# Patient Record
Sex: Female | Born: 1955 | Race: White | Hispanic: No | Marital: Married | Smoking: Never smoker
Health system: Southern US, Community
[De-identification: ages and names within clinical notes are randomized; demographics above are authoritative.]

## PROBLEM LIST (undated history)

## (undated) DIAGNOSIS — J45909 Unspecified asthma, uncomplicated: Secondary | ICD-10-CM

## (undated) DIAGNOSIS — T7840XA Allergy, unspecified, initial encounter: Secondary | ICD-10-CM

## (undated) DIAGNOSIS — Z9889 Other specified postprocedural states: Secondary | ICD-10-CM

## (undated) DIAGNOSIS — R112 Nausea with vomiting, unspecified: Secondary | ICD-10-CM

## (undated) DIAGNOSIS — D649 Anemia, unspecified: Secondary | ICD-10-CM

## (undated) DIAGNOSIS — K635 Polyp of colon: Secondary | ICD-10-CM

## (undated) DIAGNOSIS — I10 Essential (primary) hypertension: Secondary | ICD-10-CM

## (undated) DIAGNOSIS — K760 Fatty (change of) liver, not elsewhere classified: Secondary | ICD-10-CM

## (undated) DIAGNOSIS — K219 Gastro-esophageal reflux disease without esophagitis: Secondary | ICD-10-CM

## (undated) DIAGNOSIS — M1990 Unspecified osteoarthritis, unspecified site: Secondary | ICD-10-CM

## (undated) HISTORY — PX: ABDOMINAL HYSTERECTOMY: SHX81

## (undated) HISTORY — PX: WISDOM TOOTH EXTRACTION: SHX21

## (undated) HISTORY — PX: BREAST SURGERY: SHX581

## (undated) HISTORY — DX: Allergy, unspecified, initial encounter: T78.40XA

## (undated) HISTORY — DX: Polyp of colon: K63.5

## (undated) HISTORY — PX: UPPER GI ENDOSCOPY: SHX6162

## (undated) HISTORY — DX: Fatty (change of) liver, not elsewhere classified: K76.0

## (undated) HISTORY — PX: KNEE ARTHROSCOPY: SUR90

---

## 1976-11-21 HISTORY — PX: TUBAL LIGATION: SHX77

## 1977-11-21 HISTORY — PX: LEG SURGERY: SHX1003

## 1995-11-22 HISTORY — PX: EYE SURGERY: SHX253

## 2000-03-28 ENCOUNTER — Other Ambulatory Visit: Admission: RE | Admit: 2000-03-28 | Discharge: 2000-03-28 | Payer: Self-pay | Admitting: Gynecology

## 2003-09-09 ENCOUNTER — Other Ambulatory Visit: Admission: RE | Admit: 2003-09-09 | Discharge: 2003-09-09 | Payer: Self-pay | Admitting: Gynecology

## 2004-01-28 ENCOUNTER — Ambulatory Visit (HOSPITAL_BASED_OUTPATIENT_CLINIC_OR_DEPARTMENT_OTHER): Admission: RE | Admit: 2004-01-28 | Discharge: 2004-01-28 | Payer: Self-pay | Admitting: Orthopedic Surgery

## 2005-11-21 HISTORY — PX: FRACTURE SURGERY: SHX138

## 2005-12-05 ENCOUNTER — Other Ambulatory Visit: Admission: RE | Admit: 2005-12-05 | Discharge: 2005-12-05 | Payer: Self-pay | Admitting: Gynecology

## 2008-03-21 HISTORY — PX: SHOULDER ARTHROSCOPY: SHX128

## 2008-11-12 ENCOUNTER — Ambulatory Visit (HOSPITAL_BASED_OUTPATIENT_CLINIC_OR_DEPARTMENT_OTHER): Admission: RE | Admit: 2008-11-12 | Discharge: 2008-11-12 | Payer: Self-pay | Admitting: Orthopedic Surgery

## 2011-04-05 NOTE — Op Note (Signed)
NAMEAMINTA, SAKURAI               ACCOUNT NO.:  [id number]   MEDICAL RECORD NO.:  [id number]          PATIENT TYPE:  AMB   LOCATION:  DSC                          FACILITY:  [facility]   PHYSICIAN:  Mila Homer. Sherlean Foot, M.D. DATE OF BIRTH:  09-28-1956   DATE OF PROCEDURE:  11/12/2008  DATE OF DISCHARGE:                               OPERATIVE REPORT   SURGEON:  Mila Homer. Sherlean Foot, MD   ASSISTANT:  Altamese Cabal, PA-C   ANESTHESIA:  General.   PREOPERATIVE DIAGNOSES:  Left shoulder impingement syndrome, rotator  cuff tear, acromioclavicular joint arthritis, and labral tearing.   POSTOPERATIVE DIAGNOSIS:  Left shoulder impingement syndrome, rotator  cuff tear, acromioclavicular joint arthritis, and labral tearing.   PROCEDURE:  Left shoulder arthroscopy, subacromial decompression, distal  clavicle resection, mini-open rotator cuff repair. and glenohumeral  debridement.   INDICATIONS FOR PROCEDURE:  The patient is 55 year old.  Informed  consent was obtained after failure of conservative measures for left  shoulder impingement syndrome and MRI evidence of rotator cuff tear.  The patient was taken to the operative room and administered general  anesthesia.  Left shoulder was prepped and draped in the usual sterile  fashion in the beach-chair position.  The anteroposterior direct lateral  portals were created with #11 blade, blunt trocar, and cannula.  Diagnostic arthroscopy revealed degenerative labral tearing and this was  debrided through the anterior portal.  I then went to the subacromial  space and performed a bursectomy from the lateral portal.  There was  incredible impingement with evidence of 1 x 1 cm rotator cuff tear.  I  then used the ArthroCare debridement wand to debride the CA ligament,  clean off the undersurface of the acromion and distal clavicle.  AC  joint impingement was obvious as well.  I then used a 4.0-mm cylindrical  bur to perform a decompression of the acromion  and the distal clavicle.  I then converted to the mini-open technique.  I extended the lateral  incision up to 3 cm and put the Arthrex shoulder retractor piercing  through the deltoid raphe.  I burred the bare of the humerus through the  tear and then placed a 5.5 Bio-Corkscrew anchor.  I placed a modified  Mason-Allen suture to repair the rotator cuff.  I then digitally  palpated and felt that it secured nicely and that the decompression was  adequate.  I irrigated and closed with 2-0 Vicryl sutures and Steri-  Strips throughout.   COMPLICATIONS:  None.   DRAINS:  None.   DRESSINGS:  Xeroform dressing sponges near shoulder dressing.           ______________________________  Mila Homer. Sherlean Foot, M.D.     SDL/MEDQ  D:  11/12/2008  T:  11/12/2008  Job:  [id number]

## 2011-04-08 NOTE — Op Note (Signed)
NAME:  Sandra Suarez, Sandra Suarez                         ACCOUNT NO.:  [id number]   MEDICAL RECORD NO.:  [id number]                   PATIENT TYPE:  AMB   LOCATION:  DSC                                  FACILITY:  [facility]   PHYSICIAN:  Mila Homer. Sherlean Foot, M.D.              DATE OF BIRTH:  1956/09/10   DATE OF PROCEDURE:  01/28/2004  DATE OF DISCHARGE:                                 OPERATIVE REPORT   SURGEON:  Mila Homer. Sherlean Foot, M.D.   ASSISTANT:  Jamelle Rushing, P.A.   ANESTHESIA:  General plus interscalene block preoperatively.   PREOPERATIVE DIAGNOSIS:  Right shoulder rotator cuff tear.   POSTOPERATIVE DIAGNOSIS:  Right shoulder rotator cuff tear.   PROCEDURE:  Right shoulder arthroscopy with glenohumeral debridement,  subacromial decompression, distal clavicle resection, and mini-open rotator  cuff repair.   INDICATIONS FOR PROCEDURE:  The patient is a 55 year old white female with  MRI evidence of a rotator cuff tearing and synovitis, AC joint changes, as  well.   DESCRIPTION OF PROCEDURE:  The patient was laid supine and administered  general anesthesia after preoperative interscalene block.  She was then  placed in the beach chair position and the right shoulder was prepped and  draped in the usual sterile fashion.  A #11 blade, blunt trocar, and cannula  were used to create anterior, posterior, and direct lateral portals.  Glenohumeral arthroscopy revealed a lot of synovitis in the joint as well as  a large rotator cuff tear.  The anterior portal was used to put the 3.5  Gator shaver in and debride the synovitis.  The ArthroCare wand followed to  obtain hemostasis.  I then redirected the scope from the posterior into the  subacromial space.  She had a very prominent distal clavicular head as well  as a very prominent type 3 acromion.  I used the 4 mm cylindrical bur to  perform a very aggressive anterolateral acromioplasty and distal clavicle  resection.  I then released the CA  ligament and completed a bursectomy.  I  then converted to a mini-open technique by using a #15 blade, extending the  direct lateral portal up for a 3 cm incision in total length.  I then  undermined the skin a bit and placed in an Arthrex shoulder retractor  holding back the deltoid where I had gone through with my portal and split  the deltoid proximally and distally along the length of the incision in line  with its raphe.  I then tagged the rotator cuff with five stay sutures which  were 0 Vicryl placed with a Vicryl punch.  I then debrided the articular  surface of the humerus next to the bare area.  I had to medialize the tendon  approximately 1 cm due to the retracted nature of the tendon.  The rotator  cuff was very large measuring approximately 5 cm AP and retracted about 4  cm.  The subscap was torn anterior to the rotator cuff interval.  I placed a  single 5 mm bio-corkscrew anterior to the biceps tendon and brought down the  subscap with a simple and vertical mattress stitch.  I then oversewed the  interval with a figure-of-eight Vicryl stitch basically tenodesing the  biceps tendon.  I then went posterior to the biceps tendon and placed four  anchors.  I placed three vertical mattress, one horizontal mattress, and  four simple sutures.  I then oversewed that with a couple of #2 FiberWire  sutures closing the rotator cuff interval posteriorly, as well.  This  afforded excellent closure.  It was, however, 1 cm medialized.  There was  adequate decompression.  I then irrigated  and closed the deltoid interval with interrupted 0 Vicryl figure-of-eight  sutures, the subcuticular level with interrupted 2-0 Vicryl sutures, Steri-  Strips, and then a single 4-0 nylon stitch in each portal.  I dressed with  Adaptic, 4 by 4s, ABDs, 2 inch silk tape, a sling and swathe.  Complications  were none.  Drains were none.                                               Mila Homer. Sherlean Foot,  M.D.    SDL/MEDQ  D:  01/28/2004  T:  01/28/2004  Job:  [id number]

## 2011-08-26 LAB — BASIC METABOLIC PANEL
CO2: 26 mEq/L (ref 19–32)
Chloride: 104 mEq/L (ref 96–112)
GFR calc Af Amer: >60 mL/min (ref >60)
Potassium: 4.1 mEq/L (ref 3.5–5.1)
Sodium: 138 mEq/L (ref 135–145)

## 2013-11-21 DIAGNOSIS — K635 Polyp of colon: Secondary | ICD-10-CM

## 2013-11-21 HISTORY — DX: Polyp of colon: K63.5

## 2014-01-03 ENCOUNTER — Other Ambulatory Visit: Payer: Self-pay | Admitting: Orthopedic Surgery

## 2014-01-06 ENCOUNTER — Other Ambulatory Visit: Payer: Self-pay | Admitting: Orthopedic Surgery

## 2014-01-14 ENCOUNTER — Encounter (HOSPITAL_COMMUNITY): Payer: Self-pay | Admitting: Pharmacy Technician

## 2014-01-15 ENCOUNTER — Encounter (HOSPITAL_COMMUNITY): Payer: Self-pay

## 2014-01-15 ENCOUNTER — Inpatient Hospital Stay (HOSPITAL_COMMUNITY)
Admission: RE | Admit: 2014-01-15 | Discharge: 2014-01-15 | Disposition: A | Discharge status: Home / Self Care | Payer: Self-pay | Source: Ambulatory Visit

## 2014-01-15 ENCOUNTER — Encounter (HOSPITAL_COMMUNITY)
Admission: RE | Admit: 2014-01-15 | Discharge: 2014-01-15 | Disposition: A | Discharge status: Home / Self Care | Payer: BC Managed Care – PPO | Source: Ambulatory Visit | Attending: Orthopedic Surgery | Admitting: Orthopedic Surgery

## 2014-01-15 DIAGNOSIS — Z01818 Encounter for other preprocedural examination: Secondary | ICD-10-CM | POA: Insufficient documentation

## 2014-01-15 DIAGNOSIS — Z01812 Encounter for preprocedural laboratory examination: Secondary | ICD-10-CM | POA: Insufficient documentation

## 2014-01-15 HISTORY — DX: Unspecified asthma, uncomplicated: J45.909

## 2014-01-15 HISTORY — DX: Nausea with vomiting, unspecified: R11.2

## 2014-01-15 HISTORY — DX: Gastro-esophageal reflux disease without esophagitis: K21.9

## 2014-01-15 HISTORY — DX: Essential (primary) hypertension: I10

## 2014-01-15 HISTORY — DX: Unspecified osteoarthritis, unspecified site: M19.90

## 2014-01-15 HISTORY — DX: Anemia, unspecified: D64.9

## 2014-01-15 HISTORY — DX: Other specified postprocedural states: Z98.890

## 2014-01-15 LAB — COMPREHENSIVE METABOLIC PANEL
ALT: 44 U/L — ABNORMAL HIGH (ref 0–35)
AST: 28 U/L (ref 0–37)
Albumin: 3.9 g/dL (ref 3.5–5.2)
Alkaline Phosphatase: 118 U/L — ABNORMAL HIGH (ref 39–117)
BUN: 29 mg/dL — ABNORMAL HIGH (ref 6–23)
CO2: 25 mEq/L (ref 19–32)
Calcium: 9.9 mg/dL (ref 8.4–10.5)
Chloride: 101 mEq/L (ref 96–112)
Creatinine, Ser: 0.64 mg/dL (ref 0.50–1.10)
GFR calc Af Amer: >90 mL/min (ref >90)
GFR calc non Af Amer: >90 mL/min (ref >90)
Glucose, Bld: 89 mg/dL (ref 70–99)
Potassium: 4 mEq/L (ref 3.7–5.3)
Sodium: 142 mEq/L (ref 137–147)
Total Bilirubin: 0.4 mg/dL (ref 0.3–1.2)
Total Protein: 7.4 g/dL (ref 6.0–8.3)

## 2014-01-15 LAB — CBC WITH DIFFERENTIAL/PLATELET
Basophils Absolute: 0 10*3/uL (ref 0.0–0.1)
Basophils Relative: 1 % (ref 0–1)
Eosinophils Absolute: 0.1 10*3/uL (ref 0.0–0.7)
Eosinophils Relative: 1 % (ref 0–5)
HCT: 42 % (ref 36.0–46.0)
Hemoglobin: 14.6 g/dL (ref 12.0–15.0)
Lymphocytes Relative: 28 % (ref 12–46)
Lymphs Abs: 2 10*3/uL (ref 0.7–4.0)
MCH: 30.1 pg (ref 26.0–34.0)
MCHC: 34.8 g/dL (ref 30.0–36.0)
MCV: 86.6 fL (ref 78.0–100.0)
Monocytes Absolute: 0.9 10*3/uL (ref 0.1–1.0)
Monocytes Relative: 12 % (ref 3–12)
Neutro Abs: 4.4 10*3/uL (ref 1.7–7.7)
Neutrophils Relative %: 59 % (ref 43–77)
Platelets: 313 10*3/uL (ref 150–400)
RBC: 4.85 MIL/uL (ref 3.87–5.11)
RDW: 12.5 % (ref 11.5–15.5)
WBC: 7.4 10*3/uL (ref 4.0–10.5)

## 2014-01-15 LAB — URINALYSIS, ROUTINE W REFLEX MICROSCOPIC
Bilirubin Urine: NEGATIVE
Glucose, UA: NEGATIVE mg/dL
Hgb urine dipstick: NEGATIVE
Ketones, ur: NEGATIVE mg/dL
Nitrite: NEGATIVE
Protein, ur: NEGATIVE mg/dL
Specific Gravity, Urine: 1.028 (ref 1.005–1.030)
Urobilinogen, UA: 0.2 mg/dL (ref 0.0–1.0)
pH: 5.5 (ref 5.0–8.0)

## 2014-01-15 LAB — TYPE AND SCREEN
ABO/RH(D): A POS
Antibody Screen: NEGATIVE

## 2014-01-15 LAB — APTT: aPTT: 24 seconds (ref 24–37)

## 2014-01-15 LAB — URINE MICROSCOPIC-ADD ON

## 2014-01-15 LAB — ABO/RH: ABO/RH(D): A POS

## 2014-01-15 LAB — SURGICAL PCR SCREEN
MRSA, PCR: NEGATIVE
Staphylococcus aureus: NEGATIVE

## 2014-01-15 LAB — PROTIME-INR
INR: 0.96 (ref 0.00–1.49)
Prothrombin Time: 12.6 seconds (ref 11.6–15.2)

## 2014-01-15 NOTE — Progress Notes (Signed)
01/15/14 1130  OBSTRUCTIVE SLEEP APNEA  Have you ever been diagnosed with sleep apnea through a sleep study? No  Do you snore loudly (loud enough to be heard through closed doors)?  1  Do you often feel tired, fatigued, or sleepy during the daytime? 0  Has anyone observed you stop breathing during your sleep? 0  Do you have, or are you being treated for high blood pressure? 1  BMI more than 35 kg/m2? 1  Age over 58 years old? 1  Neck circumference greater than 40 cm/18 inches? 0 (15.5)  Gender: 0  Obstructive Sleep Apnea Score 4  Score 4 or greater  Results sent to PCP

## 2014-01-15 NOTE — Pre-Procedure Instructions (Addendum)
Sandra Suarez  01/15/2014   Your procedure is scheduled on:  Monday January 20, 2014 at 8:45 AM.  Report to [place] Stay Entrance "A"  Admitting at 630 AM.  Call this number if you have problems the morning of surgery: [phone]   Remember:   Do not eat food or drink liquids after midnight.   Take these medicines the morning of surgery with A SIP OF WATER: prevacid,singulair         STOP all herbel meds, nsaids (aleve,naproxen,advil,ibuprofen) now including vitamins ,aspirin   Do not wear jewelry, make-up or nail polish.  Do not wear lotions, powders, or perfumes. You may wear deodorant.  Do not shave 48 hours prior to surgery.   Do not bring valuables to the hospital.  [facility] is not responsible for any belongings or valuables.               Contacts, dentures or bridgework may not be worn into surgery.  Leave suitcase in the car. After surgery it may be brought to your room.  For patients admitted to the hospital, discharge time is determined by your treatment team.               Patients discharged the day of surgery will not be allowed to drive home.  Name and phone number of your driver: Family/Friend  Special Instructions:  Special Instructions: [name] - Preparing for Surgery  Before surgery, you can play an important role.  Because skin is not sterile, your skin needs to be as free of germs as possible.  You can reduce the number of germs on you skin by washing with CHG (chlorahexidine gluconate) soap before surgery.  CHG is an antiseptic cleaner which kills germs and bonds with the skin to continue killing germs even after washing.  Please DO NOT use if you have an allergy to CHG or antibacterial soaps.  If your skin becomes reddened/irritated stop using the CHG and inform your nurse when you arrive at Short Stay.  Do not shave (including legs and underarms) for at least 48 hours prior to the first CHG shower.  You may shave your face.  Please follow these  instructions carefully:   1.  Shower with CHG Soap the night before surgery and the morning of Surgery.  2.  If you choose to wash your hair, wash your hair first as usual with your normal shampoo.  3.  After you shampoo, rinse your hair and body thoroughly to remove the Shampoo.  4.  Use CHG as you would any other liquid soap.  You can apply chg directly  to the skin and wash gently with scrungie or a clean washcloth.  5.  Apply the CHG Soap to your body ONLY FROM THE NECK DOWN.  Do not use on open wounds or open sores.  Avoid contact with your eyes ears, mouth and genitals (private parts).  Wash genitals (private parts)       with your normal soap.  6.  Wash thoroughly, paying special attention to the area where your surgery will be performed.  7.  Thoroughly rinse your body with warm water from the neck down.  8.  DO NOT shower/wash with your normal soap after using and rinsing off the CHG Soap.  9.  Pat yourself dry with a clean towel.            10.  Wear clean pajamas.  11.  Place clean sheets on your bed the night of your first shower and do not sleep with pets.  Day of Surgery  Do not apply any lotions/deodorants the morning of surgery.  Please wear clean clothes to the hospital/surgery center.   Please read over the following fact sheets that you were given: Pain Booklet, Coughing and Deep Breathing, Blood Transfusion Information, Total Joint Packet, MRSA Information and Surgical Site Infection Prevention

## 2014-01-15 NOTE — Progress Notes (Signed)
req'd ekg from [facility] family [place]

## 2014-01-16 LAB — URINE CULTURE

## 2014-01-19 MED ORDER — CEFAZOLIN SODIUM-DEXTROSE 2-3 GM-% IV SOLR
2.0000 g | INTRAVENOUS | Status: AC
  Administered 2014-01-20: 2 g via INTRAVENOUS
  Filled 2014-01-19: qty 50

## 2014-01-19 MED ORDER — SODIUM CHLORIDE 0.9 % IV SOLN
1000.0000 mg | INTRAVENOUS | Status: AC
  Administered 2014-01-20: 1000 mg via INTRAVENOUS
  Filled 2014-01-19: qty 10

## 2014-01-19 MED ORDER — BUPIVACAINE LIPOSOME 1.3 % IJ SUSP
20.0000 mL | Freq: Once | INTRAMUSCULAR | Status: DC
  Filled 2014-01-19: qty 20

## 2014-01-20 ENCOUNTER — Inpatient Hospital Stay (HOSPITAL_COMMUNITY): Payer: BC Managed Care – PPO | Admitting: Anesthesiology

## 2014-01-20 ENCOUNTER — Encounter (HOSPITAL_COMMUNITY)
Admission: RE | Disposition: A | Discharge status: Home Health | Payer: Self-pay | Source: Ambulatory Visit | Attending: Orthopedic Surgery

## 2014-01-20 ENCOUNTER — Encounter (HOSPITAL_COMMUNITY): Payer: BC Managed Care – PPO | Admitting: Anesthesiology

## 2014-01-20 ENCOUNTER — Encounter (HOSPITAL_COMMUNITY): Payer: Self-pay | Admitting: *Deleted

## 2014-01-20 ENCOUNTER — Inpatient Hospital Stay (HOSPITAL_COMMUNITY)
Admission: RE | Admit: 2014-01-20 | Discharge: 2014-01-21 | DRG: 470 | Disposition: A | Discharge status: Home Health | Payer: BC Managed Care – PPO | Source: Ambulatory Visit | Attending: Orthopedic Surgery | Admitting: Orthopedic Surgery

## 2014-01-20 DIAGNOSIS — R112 Nausea with vomiting, unspecified: Secondary | ICD-10-CM | POA: Diagnosis not present

## 2014-01-20 DIAGNOSIS — D62 Acute posthemorrhagic anemia: Secondary | ICD-10-CM | POA: Diagnosis not present

## 2014-01-20 DIAGNOSIS — Z9851 Tubal ligation status: Secondary | ICD-10-CM

## 2014-01-20 DIAGNOSIS — Z96659 Presence of unspecified artificial knee joint: Secondary | ICD-10-CM

## 2014-01-20 DIAGNOSIS — M1710 Unilateral primary osteoarthritis, unspecified knee: Principal | ICD-10-CM | POA: Diagnosis present

## 2014-01-20 DIAGNOSIS — K219 Gastro-esophageal reflux disease without esophagitis: Secondary | ICD-10-CM | POA: Diagnosis present

## 2014-01-20 DIAGNOSIS — I10 Essential (primary) hypertension: Secondary | ICD-10-CM | POA: Diagnosis present

## 2014-01-20 DIAGNOSIS — J45909 Unspecified asthma, uncomplicated: Secondary | ICD-10-CM | POA: Diagnosis present

## 2014-01-20 HISTORY — PX: TOTAL KNEE ARTHROPLASTY: SHX125

## 2014-01-20 LAB — CBC
HCT: 38.1 % (ref 36.0–46.0)
Hemoglobin: 12.8 g/dL (ref 12.0–15.0)
MCH: 29.8 pg (ref 26.0–34.0)
MCHC: 33.6 g/dL (ref 30.0–36.0)
MCV: 88.8 fL (ref 78.0–100.0)
PLATELETS: 249 10*3/uL (ref 150–400)
RBC: 4.29 MIL/uL (ref 3.87–5.11)
RDW: 12.6 % (ref 11.5–15.5)
WBC: 12.9 10*3/uL — AB (ref 4.0–10.5)

## 2014-01-20 LAB — CREATININE, SERUM
CREATININE: 0.65 mg/dL (ref 0.50–1.10)
GFR calc non Af Amer: >90 mL/min (ref >90)

## 2014-01-20 SURGERY — ARTHROPLASTY, KNEE, TOTAL
Anesthesia: General | Site: Knee | Laterality: Left

## 2014-01-20 MED ORDER — BUPIVACAINE-EPINEPHRINE PF 0.5-1:200000 % IJ SOLN
INTRAMUSCULAR | Status: DC | PRN
  Administered 2014-01-20: 30 mL via PERINEURAL

## 2014-01-20 MED ORDER — ONDANSETRON HCL 4 MG/2ML IJ SOLN
INTRAMUSCULAR | Status: DC | PRN
  Administered 2014-01-20: 4 mg via INTRAVENOUS

## 2014-01-20 MED ORDER — METOCLOPRAMIDE HCL 10 MG PO TABS: 5.0000 mg | ORAL_TABLET | Freq: Three times a day (TID) | ORAL | Status: DC | PRN

## 2014-01-20 MED ORDER — HYDROCHLOROTHIAZIDE 12.5 MG PO CAPS
12.5000 mg | ORAL_CAPSULE | Freq: Every day | ORAL | Status: DC
  Administered 2014-01-20 – 2014-01-21 (×2): 12.5 mg via ORAL
  Filled 2014-01-20 (×2): qty 1

## 2014-01-20 MED ORDER — FENTANYL CITRATE 0.05 MG/ML IJ SOLN
INTRAMUSCULAR | Status: AC
  Filled 2014-01-20: qty 5

## 2014-01-20 MED ORDER — OXYCODONE HCL 5 MG PO TABS: 5.0000 mg | ORAL_TABLET | Freq: Once | ORAL | Status: DC | PRN

## 2014-01-20 MED ORDER — BUPIVACAINE LIPOSOME 1.3 % IJ SUSP
INTRAMUSCULAR | Status: DC | PRN
  Administered 2014-01-20: 20 mL

## 2014-01-20 MED ORDER — CHLORHEXIDINE GLUCONATE 4 % EX LIQD
60.0000 mL | Freq: Once | CUTANEOUS | Status: DC
  Filled 2014-01-20: qty 60

## 2014-01-20 MED ORDER — ENOXAPARIN SODIUM 30 MG/0.3ML ~~LOC~~ SOLN
30.0000 mg | Freq: Two times a day (BID) | SUBCUTANEOUS | Status: DC
Start: 2014-01-21 — End: 2014-01-21
  Administered 2014-01-21: 30 mg via SUBCUTANEOUS
  Filled 2014-01-20 (×3): qty 0.3

## 2014-01-20 MED ORDER — HYPROMELLOSE (GONIOSCOPIC) 2.5 % OP SOLN
1.0000 [drp] | Freq: Every day | OPHTHALMIC | Status: DC | PRN
  Filled 2014-01-20: qty 15

## 2014-01-20 MED ORDER — OXYCODONE HCL 5 MG/5ML PO SOLN: 5.0000 mg | Freq: Once | ORAL | Status: DC | PRN

## 2014-01-20 MED ORDER — ACETAMINOPHEN 325 MG PO TABS: 650.0000 mg | ORAL_TABLET | Freq: Four times a day (QID) | ORAL | Status: DC | PRN

## 2014-01-20 MED ORDER — MIDAZOLAM HCL 5 MG/5ML IJ SOLN
INTRAMUSCULAR | Status: DC | PRN
  Administered 2014-01-20 (×4): 1 mg via INTRAVENOUS

## 2014-01-20 MED ORDER — PHENOL 1.4 % MT LIQD: 1.0000 | OROMUCOSAL | Status: DC | PRN

## 2014-01-20 MED ORDER — BUPIVACAINE-EPINEPHRINE 0.5% -1:200000 IJ SOLN
INTRAMUSCULAR | Status: DC | PRN
  Administered 2014-01-20: 30 mL

## 2014-01-20 MED ORDER — LACTATED RINGERS IV SOLN
INTRAVENOUS | Status: DC | PRN
  Administered 2014-01-20 (×2): via INTRAVENOUS

## 2014-01-20 MED ORDER — SENNOSIDES-DOCUSATE SODIUM 8.6-50 MG PO TABS: 1.0000 | ORAL_TABLET | Freq: Every evening | ORAL | Status: DC | PRN

## 2014-01-20 MED ORDER — MIDAZOLAM HCL 2 MG/2ML IJ SOLN
INTRAMUSCULAR | Status: AC
  Filled 2014-01-20: qty 2

## 2014-01-20 MED ORDER — BISACODYL 5 MG PO TBEC: 5.0000 mg | DELAYED_RELEASE_TABLET | Freq: Every day | ORAL | Status: DC | PRN

## 2014-01-20 MED ORDER — HYDROMORPHONE HCL PF 1 MG/ML IJ SOLN
0.2500 mg | INTRAMUSCULAR | Status: DC | PRN
  Administered 2014-01-20 (×2): 0.5 mg via INTRAVENOUS

## 2014-01-20 MED ORDER — METHOCARBAMOL 500 MG PO TABS
500.0000 mg | ORAL_TABLET | Freq: Four times a day (QID) | ORAL | Status: DC | PRN
  Administered 2014-01-21: 500 mg via ORAL
  Filled 2014-01-20: qty 1

## 2014-01-20 MED ORDER — FENTANYL CITRATE 0.05 MG/ML IJ SOLN
INTRAMUSCULAR | Status: DC | PRN
  Administered 2014-01-20 (×5): 50 ug via INTRAVENOUS

## 2014-01-20 MED ORDER — OXYCODONE HCL ER 10 MG PO T12A
10.0000 mg | EXTENDED_RELEASE_TABLET | Freq: Two times a day (BID) | ORAL | Status: DC
  Administered 2014-01-20: 10 mg via ORAL
  Filled 2014-01-20 (×3): qty 1

## 2014-01-20 MED ORDER — ONDANSETRON HCL 4 MG/2ML IJ SOLN
INTRAMUSCULAR | Status: AC
  Filled 2014-01-20: qty 2

## 2014-01-20 MED ORDER — FERROUS FUMARATE 325 (106 FE) MG PO TABS
1.0000 | ORAL_TABLET | Freq: Every day | ORAL | Status: DC
  Filled 2014-01-20 (×2): qty 1

## 2014-01-20 MED ORDER — FLUTICASONE PROPIONATE HFA 44 MCG/ACT IN AERO
2.0000 | INHALATION_SPRAY | Freq: Two times a day (BID) | RESPIRATORY_TRACT | Status: DC
  Filled 2014-01-20: qty 10.6

## 2014-01-20 MED ORDER — ACETAMINOPHEN 650 MG RE SUPP: 650.0000 mg | Freq: Four times a day (QID) | RECTAL | Status: DC | PRN

## 2014-01-20 MED ORDER — DIPHENHYDRAMINE HCL 12.5 MG/5ML PO ELIX: 12.5000 mg | ORAL_SOLUTION | ORAL | Status: DC | PRN

## 2014-01-20 MED ORDER — ONDANSETRON HCL 4 MG/2ML IJ SOLN: 4.0000 mg | Freq: Once | INTRAMUSCULAR | Status: DC | PRN

## 2014-01-20 MED ORDER — PROPOFOL 10 MG/ML IV BOLUS
INTRAVENOUS | Status: AC
  Filled 2014-01-20: qty 20

## 2014-01-20 MED ORDER — ALUM & MAG HYDROXIDE-SIMETH 200-200-20 MG/5ML PO SUSP: 30.0000 mL | ORAL | Status: DC | PRN

## 2014-01-20 MED ORDER — ARTIFICIAL TEARS OP OINT
TOPICAL_OINTMENT | OPHTHALMIC | Status: DC | PRN
  Administered 2014-01-20: 1 via OPHTHALMIC

## 2014-01-20 MED ORDER — ONDANSETRON HCL 4 MG/2ML IJ SOLN
4.0000 mg | Freq: Four times a day (QID) | INTRAMUSCULAR | Status: DC | PRN
  Administered 2014-01-20: 4 mg via INTRAVENOUS
  Filled 2014-01-20: qty 2

## 2014-01-20 MED ORDER — LIDOCAINE HCL (CARDIAC) 20 MG/ML IV SOLN
INTRAVENOUS | Status: DC | PRN
  Administered 2014-01-20: 100 mg via INTRAVENOUS

## 2014-01-20 MED ORDER — HYDROMORPHONE HCL PF 1 MG/ML IJ SOLN
1.0000 mg | INTRAMUSCULAR | Status: DC | PRN
  Administered 2014-01-20: 1 mg via INTRAVENOUS
  Filled 2014-01-20: qty 1

## 2014-01-20 MED ORDER — ONDANSETRON HCL 4 MG PO TABS: 4.0000 mg | ORAL_TABLET | Freq: Four times a day (QID) | ORAL | Status: DC | PRN

## 2014-01-20 MED ORDER — SODIUM CHLORIDE 0.9 % IV SOLN: INTRAVENOUS | Status: DC

## 2014-01-20 MED ORDER — SODIUM CHLORIDE 0.9 % IV SOLN
INTRAVENOUS | Status: DC
  Administered 2014-01-20: 13:00:00 via INTRAVENOUS

## 2014-01-20 MED ORDER — CEFAZOLIN SODIUM-DEXTROSE 2-3 GM-% IV SOLR
2.0000 g | Freq: Four times a day (QID) | INTRAVENOUS | Status: AC
  Administered 2014-01-20 (×2): 2 g via INTRAVENOUS
  Filled 2014-01-20 (×3): qty 50

## 2014-01-20 MED ORDER — PROPOFOL 10 MG/ML IV BOLUS
INTRAVENOUS | Status: DC | PRN
  Administered 2014-01-20: 150 mg via INTRAVENOUS

## 2014-01-20 MED ORDER — DEXTROSE 5 % IV SOLN
INTRAVENOUS | Status: DC | PRN
  Administered 2014-01-20: 09:00:00 via INTRAVENOUS

## 2014-01-20 MED ORDER — LIDOCAINE HCL (CARDIAC) 20 MG/ML IV SOLN
INTRAVENOUS | Status: AC
  Filled 2014-01-20: qty 5

## 2014-01-20 MED ORDER — BUPIVACAINE-EPINEPHRINE (PF) 0.5% -1:200000 IJ SOLN
INTRAMUSCULAR | Status: AC
  Filled 2014-01-20: qty 10

## 2014-01-20 MED ORDER — DOCUSATE SODIUM 100 MG PO CAPS
100.0000 mg | ORAL_CAPSULE | Freq: Two times a day (BID) | ORAL | Status: DC
  Administered 2014-01-20 – 2014-01-21 (×3): 100 mg via ORAL
  Filled 2014-01-20 (×4): qty 1

## 2014-01-20 MED ORDER — MEPERIDINE HCL 25 MG/ML IJ SOLN: 6.2500 mg | INTRAMUSCULAR | Status: DC | PRN

## 2014-01-20 MED ORDER — 0.9 % SODIUM CHLORIDE (POUR BTL) OPTIME
TOPICAL | Status: DC | PRN
  Administered 2014-01-20: 1000 mL

## 2014-01-20 MED ORDER — IRBESARTAN 150 MG PO TABS
150.0000 mg | ORAL_TABLET | Freq: Every day | ORAL | Status: DC
  Administered 2014-01-20 – 2014-01-21 (×2): 150 mg via ORAL
  Filled 2014-01-20 (×2): qty 1

## 2014-01-20 MED ORDER — MENTHOL 3 MG MT LOZG: 1.0000 | LOZENGE | OROMUCOSAL | Status: DC | PRN

## 2014-01-20 MED ORDER — BUDESONIDE-FORMOTEROL FUMARATE 80-4.5 MCG/ACT IN AERO
2.0000 | INHALATION_SPRAY | Freq: Two times a day (BID) | RESPIRATORY_TRACT | Status: DC
  Administered 2014-01-20 – 2014-01-21 (×2): 2 via RESPIRATORY_TRACT
  Filled 2014-01-20: qty 6.9

## 2014-01-20 MED ORDER — METHOCARBAMOL 100 MG/ML IJ SOLN
500.0000 mg | Freq: Four times a day (QID) | INTRAVENOUS | Status: DC | PRN
  Administered 2014-01-20: 500 mg via INTRAVENOUS
  Filled 2014-01-20 (×3): qty 5

## 2014-01-20 MED ORDER — FLEET ENEMA 7-19 GM/118ML RE ENEM: 1.0000 | ENEMA | Freq: Once | RECTAL | Status: AC | PRN

## 2014-01-20 MED ORDER — METOCLOPRAMIDE HCL 5 MG/ML IJ SOLN
5.0000 mg | Freq: Three times a day (TID) | INTRAMUSCULAR | Status: DC | PRN
  Administered 2014-01-20: 10 mg via INTRAVENOUS
  Filled 2014-01-20: qty 2

## 2014-01-20 MED ORDER — OXYCODONE HCL 5 MG PO TABS
5.0000 mg | ORAL_TABLET | ORAL | Status: DC | PRN
  Administered 2014-01-21: 5 mg via ORAL
  Administered 2014-01-21 (×2): 10 mg via ORAL
  Administered 2014-01-21: 5 mg via ORAL
  Filled 2014-01-20: qty 1
  Filled 2014-01-20 (×2): qty 2
  Filled 2014-01-20: qty 1

## 2014-01-20 MED ORDER — SODIUM CHLORIDE 0.9 % IR SOLN
Status: DC | PRN
  Administered 2014-01-20: 3000 mL

## 2014-01-20 MED ORDER — CHLORHEXIDINE GLUCONATE 4 % EX LIQD
60.0000 mL | Freq: Once | CUTANEOUS | Status: DC
Start: 2014-01-20 — End: 2014-01-20
  Filled 2014-01-20: qty 60

## 2014-01-20 MED ORDER — MONTELUKAST SODIUM 10 MG PO TABS
10.0000 mg | ORAL_TABLET | Freq: Every day | ORAL | Status: DC
  Administered 2014-01-21: 10 mg via ORAL
  Filled 2014-01-20: qty 1

## 2014-01-20 MED ORDER — PANTOPRAZOLE SODIUM 20 MG PO TBEC
20.0000 mg | DELAYED_RELEASE_TABLET | Freq: Every day | ORAL | Status: DC
  Administered 2014-01-21: 20 mg via ORAL
  Filled 2014-01-20: qty 1

## 2014-01-20 MED ORDER — LACTATED RINGERS IV SOLN
INTRAVENOUS | Status: DC
  Administered 2014-01-20: 07:00:00 via INTRAVENOUS

## 2014-01-20 MED ORDER — VALSARTAN-HYDROCHLOROTHIAZIDE 160-12.5 MG PO TABS: 1.0000 | ORAL_TABLET | Freq: Every day | ORAL | Status: DC

## 2014-01-20 MED ORDER — CELECOXIB 200 MG PO CAPS
200.0000 mg | ORAL_CAPSULE | Freq: Two times a day (BID) | ORAL | Status: DC
  Administered 2014-01-20 – 2014-01-21 (×3): 200 mg via ORAL
  Filled 2014-01-20 (×4): qty 1

## 2014-01-20 MED ORDER — HYDROMORPHONE HCL PF 1 MG/ML IJ SOLN
INTRAMUSCULAR | Status: AC
  Administered 2014-01-20: 0.5 mg via INTRAVENOUS
  Filled 2014-01-20: qty 1

## 2014-01-20 SURGICAL SUPPLY — 52 items
BANDAGE ESMARK 6X9 LF (GAUZE/BANDAGES/DRESSINGS) ×1 IMPLANT
BLADE SAGITTAL 13X1.27X60 (BLADE) ×2 IMPLANT
BLADE SAW SGTL 83.5X18.5 (BLADE) ×2 IMPLANT
BNDG ESMARK 6X9 LF (GAUZE/BANDAGES/DRESSINGS) ×2
BOWL SMART MIX CTS (DISPOSABLE) ×2 IMPLANT
CAP POR NKTM CP VIT E LN CER ×2 IMPLANT
CEMENT BONE SIMPLEX SPEEDSET (Cement) ×4 IMPLANT
COVER SURGICAL LIGHT HANDLE (MISCELLANEOUS) ×2 IMPLANT
CUFF TOURNIQUET SINGLE 34IN LL (TOURNIQUET CUFF) ×2 IMPLANT
DRAPE EXTREMITY T 121X128X90 (DRAPE) ×2 IMPLANT
DRAPE INCISE IOBAN 66X45 STRL (DRAPES) ×4 IMPLANT
DRAPE PROXIMA HALF (DRAPES) ×2 IMPLANT
DRAPE U-SHAPE 47X51 STRL (DRAPES) ×2 IMPLANT
DRSG ADAPTIC 3X8 NADH LF (GAUZE/BANDAGES/DRESSINGS) ×2 IMPLANT
DRSG PAD ABDOMINAL 8X10 ST (GAUZE/BANDAGES/DRESSINGS) ×2 IMPLANT
DURAPREP 26ML APPLICATOR (WOUND CARE) ×4 IMPLANT
ELECT REM PT RETURN 9FT ADLT (ELECTROSURGICAL) ×4
ELECTRODE REM PT RTRN 9FT ADLT (ELECTROSURGICAL) ×2 IMPLANT
EVACUATOR 1/8 PVC DRAIN (DRAIN) ×2 IMPLANT
GLOVE BIOGEL M 7.0 STRL (GLOVE) ×2 IMPLANT
GLOVE BIOGEL PI IND STRL 7.5 (GLOVE) ×1 IMPLANT
GLOVE BIOGEL PI IND STRL 8.5 (GLOVE) ×2 IMPLANT
GLOVE BIOGEL PI INDICATOR 7.5 (GLOVE) ×1
GLOVE BIOGEL PI INDICATOR 8.5 (GLOVE) ×2
GLOVE SURG ORTHO 8.0 STRL STRW (GLOVE) ×4 IMPLANT
GOWN PREVENTION PLUS XLARGE (GOWN DISPOSABLE) ×4 IMPLANT
GOWN STRL NON-REIN LRG LVL3 (GOWN DISPOSABLE) ×2 IMPLANT
HANDPIECE INTERPULSE COAX TIP (DISPOSABLE) ×1
HOOD PEEL AWAY FACE SHEILD DIS (HOOD) ×6 IMPLANT
KIT BASIN OR (CUSTOM PROCEDURE TRAY) ×2 IMPLANT
KIT ROOM TURNOVER OR (KITS) ×2 IMPLANT
MANIFOLD NEPTUNE II (INSTRUMENTS) ×2 IMPLANT
NEEDLE 22X1 1/2 (OR ONLY) (NEEDLE) ×2 IMPLANT
NS IRRIG 1000ML POUR BTL (IV SOLUTION) ×2 IMPLANT
PACK TOTAL JOINT (CUSTOM PROCEDURE TRAY) ×2 IMPLANT
PAD ARMBOARD 7.5X6 YLW CONV (MISCELLANEOUS) ×4 IMPLANT
PADDING CAST COTTON 6X4 STRL (CAST SUPPLIES) ×2 IMPLANT
SET HNDPC FAN SPRY TIP SCT (DISPOSABLE) ×1 IMPLANT
SPONGE GAUZE 4X4 12PLY (GAUZE/BANDAGES/DRESSINGS) ×2 IMPLANT
STAPLER VISISTAT 35W (STAPLE) ×2 IMPLANT
SUCTION FRAZIER TIP 10 FR DISP (SUCTIONS) ×2 IMPLANT
SUT BONE WAX W31G (SUTURE) ×2 IMPLANT
SUT VIC AB 0 CTB1 27 (SUTURE) ×4 IMPLANT
SUT VIC AB 1 CT1 27 (SUTURE) ×2
SUT VIC AB 1 CT1 27XBRD ANBCTR (SUTURE) ×2 IMPLANT
SUT VIC AB 2-0 CT1 27 (SUTURE) ×2
SUT VIC AB 2-0 CT1 TAPERPNT 27 (SUTURE) ×2 IMPLANT
SYR CONTROL 10ML LL (SYRINGE) ×4 IMPLANT
TOWEL OR 17X24 6PK STRL BLUE (TOWEL DISPOSABLE) ×2 IMPLANT
TOWEL OR 17X26 10 PK STRL BLUE (TOWEL DISPOSABLE) ×2 IMPLANT
TRAY FOLEY CATH 14FR (SET/KITS/TRAYS/PACK) IMPLANT
WATER STERILE IRR 1000ML POUR (IV SOLUTION) IMPLANT

## 2014-01-20 NOTE — Anesthesia Preprocedure Evaluation (Addendum)
Anesthesia Evaluation  Patient identified by MRN, date of birth, ID band Patient awake    Reviewed: Allergy & Precautions, H&P , NPO status , Patient's Chart, lab work & pertinent test results, reviewed documented beta blocker date and time   History of Anesthesia Complications (+) PONV  Airway Mallampati: II TM Distance: >3 FB Neck ROM: Full    Dental  (+) Teeth Intact, Dental Advisory Given   Pulmonary asthma ,          Cardiovascular hypertension, Pt. on medications     Neuro/Psych    GI/Hepatic GERD-  Medicated and Controlled,  Endo/Other    Renal/GU      Musculoskeletal   Abdominal   Peds  Hematology   Anesthesia Other Findings   Reproductive/Obstetrics                          Anesthesia Physical Anesthesia Plan  ASA: III  Anesthesia Plan: General   Post-op Pain Management:    Induction: Intravenous  Airway Management Planned: LMA  Additional Equipment:   Intra-op Plan:   Post-operative Plan: Extubation in OR  Informed Consent: I have reviewed the patients History and Physical, chart, labs and discussed the procedure including the risks, benefits and alternatives for the proposed anesthesia with the patient or authorized representative who has indicated his/her understanding and acceptance.     Plan Discussed with: CRNA and Surgeon  Anesthesia Plan Comments:         Anesthesia Quick Evaluation

## 2014-01-20 NOTE — Anesthesia Postprocedure Evaluation (Signed)
Anesthesia Post Note  Patient: Sandra Suarez  Procedure(s) Performed: Procedure(s) (LRB): TOTAL KNEE ARTHROPLASTY (Left)  Anesthesia type: general  Patient location: PACU  Post pain: Pain level controlled  Post assessment: Patient's Cardiovascular Status Stable  Last Vitals:  Filed Vitals:   01/20/14 1230  BP: 122/76  Pulse: 50  Temp: 36.4 C  Resp: 14    Post vital signs: Reviewed and stable  Level of consciousness: sedated  Complications: No apparent anesthesia complications

## 2014-01-20 NOTE — Preoperative (Signed)
Beta Blockers   Reason not to administer Beta Blockers:Not Applicable 

## 2014-01-20 NOTE — Anesthesia Procedure Notes (Addendum)
Anesthesia Regional Block:  Femoral nerve block  Pre-Anesthetic Checklist: ,, timeout performed, Correct Patient, Correct Site, Correct Laterality, Correct Procedure, Correct Position, site marked, Risks and benefits discussed,  Surgical consent,  Pre-op evaluation,  At surgeon's request and post-op pain management  Laterality: Left  Prep: chloraprep       Needles:  Injection technique: Single-shot  Needle Type: Echogenic Stimulator Needle     Needle Length: 9cm 9 cm Needle Gauge: 21 and 21 G    Additional Needles:  Procedures: ultrasound guided (picture in chart) and nerve stimulator Femoral nerve block  Nerve Stimulator or Paresthesia:  Response: 0.4 mA,   Additional Responses:   Narrative:  Start time: 01/20/2014 8:10 AM End time: 01/20/2014 8:25 AM Injection made incrementally with aspirations every 5 mL.  Performed by: Personally  Anesthesiologist: Lillia Abed MD  Additional Notes: Monitors applied. Patient sedated. Sterile prep and drape,hand hygiene and sterile gloves were used. Relevant anatomy identified.Needle position confirmed.Local anesthetic injected incrementally after negative aspiration. Local anesthetic spread visualized around nerve(s). Vascular puncture avoided. No complications. Image printed for medical record.The patient tolerated the procedure well.        Procedure Name: LMA Insertion Date/Time: 01/20/2014 8:45 AM Performed by: Williemae Area B Pre-anesthesia Checklist: Patient identified, Emergency Drugs available, Suction available and Patient being monitored Patient Re-evaluated:Patient Re-evaluated prior to inductionOxygen Delivery Method: Circle system utilized Preoxygenation: Pre-oxygenation with 100% oxygen Intubation Type: IV induction Ventilation: Mask ventilation without difficulty LMA: LMA inserted LMA Size: 4.0 Placement Confirmation: breath sounds checked- equal and bilateral and positive ETCO2 ETT to lip (cm): taped across  cheeks. Tube secured with: Tape Dental Injury: Teeth and Oropharynx as per pre-operative assessment

## 2014-01-20 NOTE — Progress Notes (Signed)
Orthopedic Tech Progress Note Patient Details:  Sandra Suarez [birth year] [id number]  CPM Left Knee CPM Left Knee: On Left Knee Flexion (Degrees): 90 Left Knee Extension (Degrees): 0 Additional Comments: Trapeze bar and foot roll   Irish Elders 01/20/2014, 12:51 PM

## 2014-01-20 NOTE — Evaluation (Signed)
Physical Therapy Evaluation Patient Details Name: Sandra Suarez MRN: [id number] DOB: Sep 17, 1956 Today's Date: 01/20/2014 Time: 1962-2297 PT Time Calculation (min): 22 min  PT Assessment / Plan / Recommendation History of Present Illness  Pt is a 58 y/o female admitted s/p L TKA.   Clinical Impression  This patient presents with acute pain and decreased functional independence following the above mentioned procedure. At the time of PT eval, pt agreed to sit on EOB but declined any other OOB mobility due to dizziness and nausea. This patient is appropriate for skilled PT interventions to address functional limitations, improve safety and independence with functional mobility, and return to PLOF.     PT Assessment  Patient needs continued PT services    Follow Up Recommendations  Home health PT;Supervision for mobility/OOB    Does the patient have the potential to tolerate intense rehabilitation      Barriers to Discharge        Equipment Recommendations  None recommended by PT    Recommendations for Other Services     Frequency 7X/week    Precautions / Restrictions Precautions Precautions: Fall;Knee Precaution Comments: Discussed towel roll under heel and NO pillow under knee.  Restrictions Weight Bearing Restrictions: Yes LLE Weight Bearing: Weight bearing as tolerated   Pertinent Vitals/Pain Pt reports 0/10 pain at rest.       Mobility  Bed Mobility Overal bed mobility: Needs Assistance Bed Mobility: Supine to Sit;Sit to Supine Supine to sit: Min assist;HOB elevated Sit to supine: Min assist;HOB elevated General bed mobility comments: VC's for sequencing and technique. Assist for movement and support of LLE.  Transfers General transfer comment: NT due to nausea and dizziness.    Exercises Total Joint Exercises Ankle Circles/Pumps: 10 reps   PT Diagnosis: Difficulty walking;Acute pain  PT Problem List: Decreased strength;Decreased range of motion;Decreased  activity tolerance;Decreased balance;Decreased mobility;Decreased knowledge of use of DME;Decreased safety awareness;Decreased knowledge of precautions;Pain PT Treatment Interventions: DME instruction;Gait training;Stair training;Functional mobility training;Therapeutic activities;Therapeutic exercise;Neuromuscular re-education;Patient/family education     PT Goals(Current goals can be found in the care plan section) Acute Rehab PT Goals Patient Stated Goal: To return home PT Goal Formulation: With patient/family Time For Goal Achievement: 01/27/14 Potential to Achieve Goals: Good  Visit Information  Last PT Received On: 01/20/14 Assistance Needed: +1 History of Present Illness: Pt is a 58 y/o female admitted s/p L TKA.        Prior [place] expects to be discharged to:: Private residence Living Arrangements: Other relatives Available Help at Discharge: Family;Available PRN/intermittently Type of Home: House Home Access: Stairs to enter [employer] of Steps: 4 Entrance Stairs-Rails: Can reach both Home Layout: One level Home Equipment: Walker - 2 wheels;Bedside commode;Shower seat - built in Prior Function Level of Independence: Independent Comments: Not working, still driving Communication Communication: No difficulties Dominant Hand: Right    Cognition  Cognition Arousal/Alertness: Awake/alert Behavior During Therapy: WFL for tasks assessed/performed Overall Cognitive Status: Within Functional Limits for tasks assessed    Extremity/Trunk Assessment Upper Extremity Assessment Upper Extremity Assessment: Defer to OT evaluation Lower Extremity Assessment Lower Extremity Assessment: LLE deficits/detail LLE Deficits / Details: Decreased strength and AROM consistent with TKA. LLE: Unable to fully assess due to pain Cervical / Trunk Assessment Cervical / Trunk Assessment: Normal   Balance Balance Overall balance assessment: Needs  assistance Sitting-balance support: Feet supported;Bilateral upper extremity supported Sitting balance-Leahy Scale: Fair  End of Session PT - End of Session Activity Tolerance: Other (  comment) (Limited by nausea and dizziness) Patient left: in bed;with call bell/phone within reach;with family/visitor present Nurse Communication: Mobility status CPM Left Knee CPM Left Knee: Off  GP     Jolyn Lent 01/20/2014, 4:57 PM  Jolyn Lent, [place], DPT [phone]

## 2014-01-20 NOTE — H&P (Signed)
Sandra Suarez MRN:  [id number] DOB/SEX:  06/12/1956/female  CHIEF COMPLAINT:  Painful left Knee  HISTORY: Patient is a 58 y.o. female presented with a history of pain in the left knee. Onset of symptoms was gradual starting several years ago with gradually worsening course since that time. Prior procedures on the knee include none. Patient has been treated conservatively with over-the-counter NSAIDs and activity modification. Patient currently rates pain in the knee at 9 out of 10 with activity. There is no pain at night.  PAST MEDICAL HISTORY: There are no active problems to display for this patient.  Past Medical History  Diagnosis Date  . PONV (postoperative nausea and vomiting)     none recently  . Hypertension   . Asthma   . GERD (gastroesophageal reflux disease)   . Arthritis   . Anemia    Past Surgical History  Procedure Laterality Date  . Shoulder arthroscopy Bilateral 05,09    rotator cuff  . Knee arthroscopy Right   . Leg surgery Right 79    fx   . Fracture surgery Left 07  . Eye surgery Left 97    tumor -plate  . Tubal ligation  78  . Abdominal hysterectomy    . Breast surgery Right     cyst x3     MEDICATIONS:   Prescriptions prior to admission  Medication Sig Dispense Refill  . calcium carbonate (OS-CAL) 600 MG TABS tablet Take 600 mg by mouth daily.      . ferrous fumarate (HEMOCYTE - 106 MG FE) 325 (106 FE) MG TABS tablet Take 1 tablet by mouth daily.      . hydroxypropyl methylcellulose (ISOPTO TEARS) 2.5 % ophthalmic solution Place 1 drop into both eyes daily as needed for dry eyes.      Marland Kitchen ibuprofen (ADVIL,MOTRIN) 200 MG tablet Take 200 mg by mouth 3 (three) times daily. Take 3 times every morning to get moving per patient      . Lansoprazole (PREVACID PO) Take 1 tablet by mouth 2 (two) times daily.      . montelukast (SINGULAIR) 10 MG tablet Take 10 mg by mouth daily.      Marland Kitchen omega-3 acid ethyl esters (LOVAZA) 1 G capsule Take 1 g by mouth daily.       . valsartan-hydrochlorothiazide (DIOVAN-HCT) 160-12.5 MG per tablet Take 1 tablet by mouth daily.      . vitamin E (VITAMIN E) 400 UNIT capsule Take 400 Units by mouth 2 (two) times daily.      . beclomethasone (QVAR) 80 MCG/ACT inhaler Inhale 2 puffs into the lungs 3 (three) times daily as needed.      . budesonide-formoterol (SYMBICORT) 80-4.5 MCG/ACT inhaler Inhale 2 puffs into the lungs 2 times daily at 12 noon and 4 pm.      . Diphenhydramine-APAP, sleep, (GOODYS PM PO) Take by mouth.      Marland Kitchen omalizumab (XOLAIR) 150 MG injection Inject into the skin every 30 (thirty) days.      . Skin Protectants, Misc. (WHITE PETROLATUM-ZINC OXIDE) cream Apply topically as needed.        ALLERGIES:  No Known Allergies  REVIEW OF SYSTEMS:  Pertinent items are noted in HPI.   FAMILY HISTORY:  No family history on file.  SOCIAL HISTORY:   History  Substance Use Topics  . Smoking status: Never Smoker   . Smokeless tobacco: Not on file  . Alcohol Use: No     EXAMINATION:  Vital signs in  last 24 hours:    General appearance: alert, cooperative and no distress Lungs: clear to auscultation bilaterally Heart: regular rate and rhythm, S1, S2 normal, no murmur, click, rub or gallop Abdomen: soft, non-tender; bowel sounds normal; no masses,  no organomegaly Extremities: extremities normal, atraumatic, no cyanosis or edema and Homans sign is negative, no sign of DVT Pulses: 2+ and symmetric Skin: Skin color, texture, turgor normal. No rashes or lesions  Musculoskeletal:  ROM 0-105, Ligaments intact,  Imaging Review Plain radiographs demonstrate severe degenerative joint disease of the left knee. The overall alignment is mild valgus. The bone quality appears to be good for age and reported activity level.  Assessment/Plan: End stage arthritis, left knee   The patient history, physical examination and imaging studies are consistent with advanced degenerative joint disease of the left knee. The  patient has failed conservative treatment.  The clearance notes were reviewed.  After discussion with the patient it was felt that Total Knee Replacement was indicated. The procedure,  risks, and benefits of total knee arthroplasty were presented and reviewed. The risks including but not limited to aseptic loosening, infection, blood clots, vascular injury, stiffness, patella tracking problems complications among others were discussed. The patient acknowledged the explanation, agreed to proceed with the plan.  Tishawn Friedhoff 01/20/2014, 6:31 AM

## 2014-01-20 NOTE — Transfer of Care (Signed)
Immediate Anesthesia Transfer of Care Note  Patient: Sandra Suarez  Procedure(s) Performed: Procedure(s): TOTAL KNEE ARTHROPLASTY (Left)  Patient Location: PACU  Anesthesia Type:General  Level of Consciousness: awake, alert  and patient cooperative  Airway & Oxygen Therapy: Patient Spontanous Breathing and Patient connected to nasal cannula oxygen  Post-op Assessment: Report given to PACU RN, Post -op Vital signs reviewed and stable and Patient moving all extremities  Post vital signs: Reviewed and stable  Complications: No apparent anesthesia complications

## 2014-01-20 NOTE — Progress Notes (Signed)
Utilization Review Completed.Sandra Suarez T3/12/2013  

## 2014-01-21 MED ORDER — ENOXAPARIN SODIUM 40 MG/0.4ML ~~LOC~~ SOLN: 40.0000 mg | SUBCUTANEOUS | Status: DC

## 2014-01-21 MED ORDER — OXYCODONE HCL ER 10 MG PO T12A: 10.0000 mg | EXTENDED_RELEASE_TABLET | Freq: Two times a day (BID) | ORAL | Status: DC

## 2014-01-21 MED ORDER — OXYCODONE HCL 5 MG PO TABS: 5.0000 mg | ORAL_TABLET | ORAL | Status: DC | PRN

## 2014-01-21 MED ORDER — ONDANSETRON HCL 4 MG PO TABS: 4.0000 mg | ORAL_TABLET | Freq: Four times a day (QID) | ORAL | Status: DC | PRN

## 2014-01-21 MED ORDER — METHOCARBAMOL 500 MG PO TABS: 500.0000 mg | ORAL_TABLET | Freq: Four times a day (QID) | ORAL | Status: DC | PRN

## 2014-01-21 NOTE — Progress Notes (Signed)
SPORTS MEDICINE AND JOINT REPLACEMENT  Sandra Mulch, MD   Sandra Spry, PA-C [place], [place], Sandra Suarez  [id number]                             [phone]   PROGRESS NOTE  Subjective:  negative for Chest Pain  negative for Shortness of Breath  positive for Nausea/Vomiting   negative for Calf Pain  negative for Bowel Movement   Tolerating Diet: yes         Patient reports pain as 6 on 0-10 scale.    Objective: Vital signs in last 24 hours:   Patient Vitals for the past 24 hrs:  BP Temp Pulse Resp SpO2 Height Weight  01/21/14 1145 - - - 18 - - -  01/21/14 0927 - - - - 98 % - -  01/21/14 0800 - - - 18 - - -  01/21/14 0609 120/64 mmHg 97.9 F (36.6 C) 56 18 99 % - -  01/20/14 2130 116/62 mmHg 98.6 F (37 C) 54 18 100 % - -  01/20/14 2035 - - - - 95 % - -  01/20/14 1700 - - - - - 5\' 2"  (1.575 m) 93.8 kg (206 lb 12.7 oz)  01/20/14 1230 122/76 mmHg 97.6 F (36.4 C) 50 14 96 % - -    @flow {1959:LAST@   Intake/Output from previous day:   03/02 0701 - 03/03 0700 In: 1900 [I.V.:1800] Out: 350 [Drains:200]   Intake/Output this shift:       Intake/Output     03/02 0701 - 03/03 0700 03/03 0701 - 03/04 0700   I.V. (mL/kg) 1800 (19.2)    Other 100    Total Intake(mL/kg) 1900 (20.3)    Drains 200    Blood 150    Total Output 350     Net +1550          Urine Occurrence 2 x       LABORATORY DATA:  Recent Labs  01/15/14 1154 01/20/14 1405  WBC 7.4 12.9*  HGB 14.6 12.8  HCT 42.0 38.1  PLT 313 249    Recent Labs  01/15/14 1154 01/20/14 1405  NA 142  --   K 4.0  --   CL 101  --   CO2 25  --   BUN 29*  --   CREATININE 0.64 0.65  GLUCOSE 89  --   CALCIUM 9.9  --    Lab Results  Component Value Date   INR 0.96 01/15/2014    Examination:  General appearance: alert, cooperative and no distress Extremities: extremities normal, atraumatic, no cyanosis or edema and Homans sign is negative, no sign of DVT  Wound Exam: clean, dry, intact    Drainage:  Scant/small amount Serosanguinous exudate  Motor Exam: EHL and FHL Intact  Sensory Exam: Deep Peroneal normal   Assessment:    1 Day Post-Op  Procedure(s) (LRB): TOTAL KNEE ARTHROPLASTY (Left)  ADDITIONAL DIAGNOSIS:  Active Problems:   S/P total knee arthroplasty  Acute Blood Loss Anemia   Plan: Physical Therapy as ordered Weight Bearing as Tolerated (WBAT)  DVT Prophylaxis:  Lovenox  DISCHARGE PLAN: Home  DISCHARGE NEEDS: HHPT, CPM, Walker and 3-in-1 comode seat         Sandra Suarez 01/21/2014, 12:14 PM

## 2014-01-21 NOTE — Progress Notes (Signed)
D/C instructions and scripts given. Pt verbalized understanding of instructions. Lovenox teaching done with Pt. Family at bedside to take Pt home at this time.

## 2014-01-21 NOTE — Care Management Note (Signed)
CARE MANAGEMENT NOTE 01/21/2014  Patient:  Sandra Suarez, Sandra Suarez   Account Number:  [id number]  Date Initiated:  01/21/2014  Documentation initiated by:  Ricki Miller  Subjective/Objective Assessment:   58 yr old female s/p left total knee arthroplasty.     Action/Plan:   Patient states she wants to use [facility]. Case Manager faxed orders to [place]  phone- [phone]   Anticipated DC Date:  01/21/2014   Anticipated DC Plan:  [place]  CM consult      [place] [facility]   Choice offered to / List presented to:  C-1 Patient   DME arranged  3-N-1  [place]  CPM      DME agency  [employer]     [place] arranged  HH-2 PT      [facility] agency  [place]   Status of service:  Completed, signed off Medicare Important Message given?   (If response is "NO", the following Medicare IM given date fields will be blank) Date Medicare IM given:   Date Additional Medicare IM given:    Discharge Disposition:  [place]

## 2014-01-21 NOTE — Progress Notes (Signed)
Physical Therapy Treatment Patient Details Name: Sandra Suarez MRN: [id number] DOB: 09/10/1956 Today's Date: 01/21/2014 Time: 5784-6962 PT Time Calculation (min): 40 min  PT Assessment / Plan / Recommendation  History of Present Illness Pt is a 58 y/o female admitted s/p L TKA.    PT Comments   Pt progressing towards physical therapy goals. Was able to perform transfers and ambulation with min guard assist. Plan to initiate stair training in afternoon session prior to d/c.   Follow Up Recommendations  Home health PT;Supervision for mobility/OOB     Does the patient have the potential to tolerate intense rehabilitation     Barriers to Discharge        Equipment Recommendations  None recommended by PT    Recommendations for Other Services    Frequency 7X/week   Progress towards PT Goals Progress towards PT goals: Progressing toward goals  Plan Current plan remains appropriate    Precautions / Restrictions Precautions Precautions: Fall;Knee Precaution Comments: Discussed towel roll under heel and NO pillow under knee.  Restrictions Weight Bearing Restrictions: Yes LLE Weight Bearing: Weight bearing as tolerated   Pertinent Vitals/Pain Pt reports increased pain with the foam roll - replaced with towel roll. Pt still complaining of increased pain so towel roll diameter decreased. Reports pain pill prior to session.     Mobility  Bed Mobility Overal bed mobility: Needs Assistance Bed Mobility: Supine to Sit Supine to sit: Min guard General bed mobility comments: Pt able to transition to EOB with min guard only. HOB flat and bed rail down.  Transfers Overall transfer level: Needs assistance Equipment used: Rolling walker (2 wheeled) Transfers: Sit to/from [facility] Sit to Stand: Min assist Stand pivot transfers: Min guard General transfer comment: Pt requested no assist from therapist, however required support on walker to keep it from tipping over. Pt  was cued to push up from bed however pt insisted on pulling up from walker.  Ambulation/Gait Ambulation/Gait assistance: Min guard Ambulation Distance (Feet): 50 Feet Assistive device: Rolling walker (2 wheeled) Gait Pattern/deviations: Step-to pattern;Decreased stride length Gait velocity: Decreased Gait velocity interpretation: Below normal speed for age/gender General Gait Details: VC's for sequencing and safety awareness with the RW. Cued specifically for increased heel strike and walker placement.     Exercises Total Joint Exercises Ankle Circles/Pumps: 10 reps Quad Sets: 10 reps Heel Slides: 10 reps Hip ABduction/ADduction: 10 reps Knee Flexion: 10 reps Goniometric ROM: 82 AROM   PT Diagnosis:    PT Problem List:   PT Treatment Interventions:     PT Goals (current goals can now be found in the care plan section) Acute Rehab PT Goals Patient Stated Goal: To return home PT Goal Formulation: With patient/family Time For Goal Achievement: 01/27/14 Potential to Achieve Goals: Good  Visit Information  Last PT Received On: 01/21/14 Assistance Needed: +1 History of Present Illness: Pt is a 58 y/o female admitted s/p L TKA.     Subjective Data  Subjective: "I am not as nauseated today." Patient Stated Goal: To return home   Cognition  Cognition Arousal/Alertness: Awake/alert Behavior During Therapy: WFL for tasks assessed/performed Overall Cognitive Status: Within Functional Limits for tasks assessed    Balance  Balance Overall balance assessment: Needs assistance Sitting-balance support: Feet supported;Bilateral upper extremity supported Sitting balance-Leahy Scale: Fair Standing balance support: Bilateral upper extremity supported Standing balance-Leahy Scale: Fair  End of Session PT - End of Session Equipment Utilized During Treatment: Gait belt Activity Tolerance: Patient tolerated  treatment well Patient left: in chair;with call bell/phone within reach Nurse  Communication: Mobility status   GP     Jolyn Lent 01/21/2014, 11:52 AM  Jolyn Lent, PT, DPT [phone]

## 2014-01-21 NOTE — Progress Notes (Signed)
Occupational Therapy Evaluation and Discharge Patient Details Name: Sandra Suarez MRN: [id number] DOB: [birth year] Today's Date: 01/21/2014 Time: 8657-8469 OT Time Calculation (min): 36 min  OT Assessment / Plan / Recommendation History of present illness Pt is a 58 y/o female admitted s/p L TKA.    Clinical Impression   PTA pt lived with grandson, who attends school during the day. She was I in ADLs and mobility. Educated and completed transfer training for toilet and walk-in shower for Independence in ADLs. Completed education and training on AE and compensatory techniques to increase independence in ADLs including leg lifter for use during bed mobility and transfers. Pt states that her family will be able to help intermittently. No further OT needs at this time. Pt ready for D/C from OT standpoint.     OT Assessment  Patient does not need any further OT services    Follow Up Recommendations  No OT follow up       Equipment Recommendations  Other (comment) (leg lifter (provided))          Precautions / Restrictions Precautions Precautions: Fall;Knee Precaution Comments: Discussed towel roll under heel and NO pillow under knee.  Restrictions Weight Bearing Restrictions: No LLE Weight Bearing: Weight bearing as tolerated   Pertinent Vitals/Pain Pt stated "it hurts like the dickens" but declined requesting for medication as she explained it makes her nauseous.     ADL  Eating/Feeding: Independent Where Assessed - Eating/Feeding: Chair Grooming: Set up;Supervision/safety Where Assessed - Grooming: Supported standing Upper Body Bathing: Supervision/safety; setup Where Assessed - Upper Body Bathing: Supported sitting Lower Body Bathing: Min A Where Assessed - Lower Body Bathing: Supported sit to stand Upper Body Dressing: Supervision/safety; setup Where Assessed - Upper Body Dressing: Supported sitting Lower Body Dressing: [place] Where Assessed - Lower Body Dressing:  Supported sit to Lobbyist: Magazine features editor Method: Sit to Loss adjuster, chartered: Raised toilet seat with arms (or 3-in-1 over toilet) Toileting - Clothing Manipulation and Hygiene: Min guard Where Assessed - Best boy and Hygiene: Sit to stand from 3-in-1 or toilet Tub/Shower Transfer: Min guard Tub/Shower Transfer Method: Therapist, art: Walk in shower (with built-in fold down seat) Equipment Used: Gait belt;Leg lifter;Rolling walker Transfers/Ambulation Related to ADLs: Pt educated on use of leg lifter for bed mobility and transfers. Pt stated that leg lifter helped her to manage her LLE ADL Comments: Educated on use of AE for dressing. Pt states that she has a reacher at home and plans to wear flip flops at home. Therapist educated pt on safety while wearing flip flops at home and pt stated she would likely wear tennis shoes for community mobility and her grandchildren can help with donning/doffing.      Visit Information  Last OT Received On: 01/21/14 Assistance Needed: +1 History of Present Illness: Pt is a 58 y/o female admitted s/p L TKA.        Prior [place] expects to be discharged to:: Private residence Living Arrangements: Other relatives Available Help at Discharge: Family;Available PRN/intermittently Type of Home: House Home Access: Stairs to enter [employer] of Steps: 4 Entrance Stairs-Rails: Can reach both Home Layout: One level Home Equipment: Walker - 2 wheels;Bedside commode;Shower seat - built in Prior Function Level of Independence: Independent Communication Communication: No difficulties Dominant Hand: Right         Vision/Perception Vision - History Patient Visual Report: No change from baseline  Cognition  Cognition Arousal/Alertness: Awake/alert Behavior During Therapy: WFL for tasks assessed/performed Overall  Cognitive Status: Within Functional Limits for tasks assessed    Extremity/Trunk Assessment Upper Extremity Assessment Upper Extremity Assessment: Overall WFL for tasks assessed     Mobility Transfers Overall transfer level: Needs assistance Equipment used: Rolling walker (2 wheeled) Transfers: Sit to/from Stand Sit to Stand: Min guard General transfer comment: Pt requested that therapist not touch her to help during transfers. Required support on walker during sit<>stand.            End of Session OT - End of Session Equipment Utilized During Treatment: Gait belt;Rolling walker;Other (comment) (leg lifter) Activity Tolerance: Patient tolerated treatment well Patient left: in chair;with call bell/phone within reach;with family/visitor present;Other (comment) (Pt requested therapist speak to case manager regarding home health agency )        Juluis Rainier [phone] 01/21/2014, 12:20 PM

## 2014-01-21 NOTE — Discharge Instructions (Signed)
Diet: As you were doing prior to hospitalization   Activity:  Increase activity slowly as tolerated                  No lifting or driving for 6 weeks  Shower:  May shower without a dressing once there is no drainage from your wound.                 Do NOT wash over the wound.                 Dressing:  You may change your dressing on Wednesday                    Then change the dressing daily with sterile 4"x4"s gauze dressing                     And TED hose for knees.  Weight Bearing:  Weight bearing as tolerated as taught in physical therapy.  Use a                                walker or Crutches as instructed.  To prevent constipation: you may use a stool softener such as -               Colace ( over the counter) 100 mg by mouth twice a day                Drink plenty of fluids ( prune juice may be helpful) and high fiber foods                Miralax ( over the counter) for constipation as needed.    Precautions:  If you experience chest pain or shortness of breath - call 911 immediately               For transfer to the hospital emergency department!!               If you develop a fever greater that 101 F, purulent drainage from wound,                             increased redness or drainage from wound, or calf pain -- Call the office.  Follow- Up Appointment:  Please call for an appointment to be seen on 02/04/14                                              [facility] office:  [phone]            [phone] [place], [place] [address]

## 2014-01-21 NOTE — Plan of Care (Signed)
Problem: Consults Goal: Diagnosis- Total Joint Replacement Primary Total Knee Left     

## 2014-01-21 NOTE — Op Note (Signed)
TOTAL KNEE REPLACEMENT OPERATIVE NOTE:  01/20/2014  1:30 PM  PATIENT:  Sandra Suarez  58 y.o. female  PRE-OPERATIVE DIAGNOSIS:  osteoarthritis left knee  POST-OPERATIVE DIAGNOSIS:  osteoarthritis left knee  PROCEDURE:  Procedure(s): TOTAL KNEE ARTHROPLASTY  SURGEON:  Surgeon(s): Vickey Huger, MD  PHYSICIAN ASSISTANT: Carlynn Spry, [facility]  ANESTHESIA:   general  DRAINS: Hemovac  SPECIMEN: None  COUNTS:  Correct  TOURNIQUET:   Total Tourniquet Time Documented: Thigh (Left) - 54 minutes Total: Thigh (Left) - 54 minutes   DICTATION:  Indication for procedure:    The patient is a 58 y.o. female who has failed conservative treatment for osteoarthritis left knee.  Informed consent was obtained prior to anesthesia. The risks versus benefits of the operation were explain and in a way the patient can, and did, understand.   On the implant demand matching protocol, this patient scored 15.  Therefore, this patient was not receive a polyethylene insert with vitamin E which is a high demand implant.  Description of procedure:     The patient was taken to the operating room and placed under anesthesia.  The patient was positioned in the usual fashion taking care that all body parts were adequately padded and/or protected.  I foley catheter was not placed.  A tourniquet was applied and the leg prepped and draped in the usual sterile fashion.  The extremity was exsanguinated with the esmarch and tourniquet inflated to 350 mmHg.  Pre-operative range of motion was normal.  The knee was in 5 degree of mild varus.  A midline incision approximately 6-7 inches long was made with a #10 blade.  A new blade was used to make a parapatellar arthrotomy going 2-3 cm into the quadriceps tendon, over the patella, and alongside the medial aspect of the patellar tendon.  A synovectomy was then performed with the #10 blade and forceps. I then elevated the deep MCL off the medial tibial metaphysis  subperiosteally around to the semimembranosus attachment.    I everted the patella and used calipers to measure patellar thickness.  I used the reamer to ream down to appropriate thickness to recreate the native thickness.  I then removed excess bone with the rongeur and sagittal saw.  I used the appropriately sized template and drilled the three lug holes.  I then put the trial in place and measured the thickness with the calipers to ensure recreation of the native thickness.  The trial was then removed and the patella subluxed and the knee brought into flexion.  A homan retractor was place to retract and protect the patella and lateral structures.  A Z-retractor was place medially to protect the medial structures.  The extra-medullary alignment system was used to make cut the tibial articular surface perpendicular to the anamotic axis of the tibia and in 3 degrees of posterior slope.  The cut surface and alignment jig was removed.  I then used the intramedullary alignment guide to make a 6 valgus cut on the distal femur.  I then marked out the epicondylar axis on the distal femur.  The posterior condylar axis measured 3 degrees.  I then used the anterior referencing sizer and measured the femur to be a size 5.  The 4-In-1 cutting block was screwed into place in external rotation matching the posterior condylar angle, making our cuts perpendicular to the epicondylar axis.  Anterior, posterior and chamfer cuts were made with the sagittal saw.  The cutting block and cut pieces were removed.  A  lamina spreader was placed in 90 degrees of flexion.  The ACL, PCL, menisci, and posterior condylar osteophytes were removed.  A 11 mm spacer blocked was found to offer good flexion and extension gap balance after mild in degree releasing.   The scoop retractor was then placed and the femoral finishing block was pinned in place.  The small sagittal saw was used as well as the lug drill to finish the femur.  The block  and cut surfaces were removed and the medullary canal hole filled with autograft bone from the cut pieces.  The tibia was delivered forward in deep flexion and external rotation.  A size C tray was selected and pinned into place centered on the medial 1/3 of the tibial tubercle.  The reamer and keel was used to prepare the tibia through the tray.    I then trialed with the size 4 femur, size C tibia, a 11 mm insert and the 32 patella.  I had excellent flexion/extension gap balance, excellent patella tracking.  Flexion was full and beyond 120 degrees; extension was zero.  These components were chosen and the staff opened them to me on the back table while the knee was lavaged copiously and the cement mixed.  The soft tissue was infiltrated with 60cc of exparel 1.3% through a 21 gauge needle.  I cemented in the components and removed all excess cement.  The polyethylene tibial component was snapped into place and the knee placed in extension while cement was hardening.  The capsule was infilltrated with 30cc of .25% Marcaine with epinephrine.  A hemovac was place in the joint exiting superolaterally.  A pain pump was place superomedially superficial to the arthrotomy.  Once the cement was hard, the tourniquet was let down.  Hemostasis was obtained.  The arthrotomy was closed with figure-8 #1 vicryl sutures.  The deep soft tissues were closed with #0 vicryls and the subcuticular layer closed with a running #2-0 vicryl.  The skin was reapproximated and closed with skin staples.  The wound was dressed with xeroform, 4 x4's, 2 ABD sponges, a single layer of webril and a TED stocking.   The patient was then awakened, extubated, and taken to the recovery room in stable condition.  BLOOD LOSS:  300cc DRAINS: 1 hemovac, 1 pain catheter COMPLICATIONS:  None.  PLAN OF CARE: Admit to inpatient   PATIENT DISPOSITION:  PACU - hemodynamically stable.   Delay start of Pharmacological VTE agent (>24hrs) due to  surgical blood loss or risk of bleeding:  not applicable  Please fax a copy of this op note to my office at [phone] (please only include page 1 and 2 of the Case Information op note)

## 2014-01-21 NOTE — Progress Notes (Signed)
Physical Therapy Treatment Note   Patient is progressing towards goals well. She completed stair training and safely navigates steps with one rail. Pt is adequate for discharge from a PT standpoint.  Pt with 4/10 pain Repositioned comfortably in CPM   01/21/14 1500  PT Visit Information  Last PT Received On 01/21/14  Assistance Needed +1  History of Present Illness Pt is a 58 y/o female admitted s/p L TKA.   PT Time Calculation  PT Start Time 1416  PT Stop Time 1453  PT Time Calculation (min) 37 min  Subjective Data  Subjective "I just got back into bed, but will practice the stairs since I am going home."  Precautions  Precautions Fall;Knee  Restrictions  Weight Bearing Restrictions Yes  LLE Weight Bearing WBAT  Cognition  Arousal/Alertness Awake/alert  Behavior During Therapy WFL for tasks assessed/performed  Overall Cognitive Status Within Functional Limits for tasks assessed  Bed Mobility  Overal bed mobility Needs Assistance  Bed Mobility Supine to Sit  Supine to sit Supervision  General bed mobility comments Pt able to transition to EOB with supervision.  Transfers  Overall transfer level Needs assistance  Equipment used Rolling walker (2 wheeled)  Transfers Sit to/from Stand  Sit to Stand Min guard  General transfer comment Verbal cues for hand placement, cued not to lean on RW.  Ambulation/Gait  Ambulation/Gait assistance Min guard  Ambulation Distance (Feet) 75 Feet  Assistive device Rolling walker (2 wheeled)  Gait Pattern/deviations Step-to pattern  Gait velocity Decreased  General Gait Details Verbal cues for L knee extension during stance phase, and training for step-through gait.  Stairs Yes  Stairs assistance Min guard  Stair Management One rail Left;Sideways;Step to pattern  Number of Stairs 2 (x2)  General stair comments Pt safely navigates 2 steps from sideway approach with Min guard and verbal cues for foot placement. She relies heavily on the  rail for support but states she is confident.  Exercises  Exercises Total Joint  Total Joint Exercises  Ankle Circles/Pumps 10 reps  Quad Sets 10 reps  Heel Slides 10 reps  Hip ABduction/ADduction 10 reps  PT - End of Session  Equipment Utilized During Treatment Gait belt  Activity Tolerance Patient tolerated treatment well  Patient left in bed;in CPM;with call bell/phone within reach  PT - Assessment/Plan  PT Plan Current plan remains appropriate  PT Frequency 7X/week  Follow Up Recommendations Home health PT;Supervision for mobility/OOB  PT equipment None recommended by PT  PT Goal Progression  Progress towards PT goals Progressing toward goals  Acute Rehab PT Goals  PT Goal Formulation With patient/family  Time For Goal Achievement 01/27/14  Potential to Achieve Goals Good  PT General Charges  $$ ACUTE PT VISIT 1 Procedure  PT Treatments  $Gait Training 23-37 mins    [place], [place]

## 2014-01-22 ENCOUNTER — Encounter (HOSPITAL_COMMUNITY): Payer: Self-pay | Admitting: Orthopedic Surgery

## 2014-01-22 NOTE — Discharge Summary (Signed)
SPORTS MEDICINE & JOINT REPLACEMENT   Lara Mulch, MD   Carlynn Spry, PA-C [address], [place], [place]  [id number]                             [phone]  PATIENT ID: Sandra Suarez        MRN:  [id number]          DOB/AGE: December 17, 1955 / 58 y.o.    DISCHARGE SUMMARY  ADMISSION DATE:    01/20/2014 DISCHARGE DATE:   01/22/2014   ADMISSION DIAGNOSIS: osteoarthritis left knee    DISCHARGE DIAGNOSIS:  osteoarthritis left knee    ADDITIONAL DIAGNOSIS: Active Problems:   S/P total knee arthroplasty  Past Medical History  Diagnosis Date  . PONV (postoperative nausea and vomiting)     none recently  . Hypertension   . Asthma   . GERD (gastroesophageal reflux disease)   . Arthritis   . Anemia     PROCEDURE: Procedure(s): TOTAL KNEE ARTHROPLASTY on 01/20/2014  CONSULTS:     HISTORY:  See H&P in chart  HOSPITAL COURSE:  Sandra Suarez is a 58 y.o. admitted on 01/20/2014 and found to have a diagnosis of osteoarthritis left knee.  After appropriate laboratory studies were obtained  they were taken to the operating room on 01/20/2014 and underwent Procedure(s): TOTAL KNEE ARTHROPLASTY.   They were given perioperative antibiotics:  Anti-infectives   Start     Dose/Rate Route Frequency Ordered Stop   01/20/14 1400  ceFAZolin (ANCEF) IVPB 2 g/50 mL premix     2 g 100 mL/hr over 30 Minutes Intravenous Every 6 hours 01/20/14 1228 01/20/14 2130   01/20/14 0600  ceFAZolin (ANCEF) IVPB 2 g/50 mL premix     2 g 100 mL/hr over 30 Minutes Intravenous On call to O.R. 01/19/14 1245 01/20/14 0847    .  Tolerated the procedure well.  Placed with a foley intraoperatively.  Given Ofirmev at induction and for 48 hours.    POD# 1: Vital signs were stable.  Patient denied Chest pain, shortness of breath, or calf pain.  Patient was started on Lovenox 30 mg subcutaneously twice daily at 8am.  Consults to PT, OT, and care management were made.  The patient was weight bearing as tolerated.   CPM was placed on the operative leg 0-90 degrees for 6-8 hours a day.  Incentive spirometry was taught.  Dressing was changed.  Marcaine pump and hemovac were discontinued.      POD #2, Continued  PT for ambulation and exercise program.  IV saline locked.  O2 discontinued.    The remainder of the hospital course was dedicated to ambulation and strengthening.   The patient was discharged on 1 day post op in  Good condition.  Blood products given:none  DIAGNOSTIC STUDIES: Recent vital signs: Patient Vitals for the past 24 hrs:  BP Temp Temp src Pulse Resp SpO2  01/21/14 1600 - - - - 18 -  01/21/14 1331 117/67 mmHg 99.1 F (37.3 C) Oral 72 18 98 %       Recent laboratory studies:  Recent Labs  01/20/14 1405  WBC 12.9*  HGB 12.8  HCT 38.1  PLT 249    Recent Labs  01/20/14 1405  CREATININE 0.65   Lab Results  Component Value Date   INR 0.96 01/15/2014     Recent Radiographic Studies :  Dg Chest 2 View  01/15/2014  CLINICAL DATA:  Preop left total knee arthroplasty.  EXAM: CHEST  2 VIEW  COMPARISON:  None.  FINDINGS: Trachea is midline. Heart size within normal limits. Descending thoracic aorta is tortuous. Lungs are clear. No pleural fluid. Old right rib fractures.  IMPRESSION: No acute findings.   Electronically Signed   By: Lorin Picket M.D.   On: 01/15/2014 13:52    DISCHARGE INSTRUCTIONS: Discharge Orders   Future Orders Complete By Expires   Call MD / Call 911  As directed    Comments:     If you experience chest pain or shortness of breath, CALL 911 and be transported to the hospital emergency room.  If you develope a fever above 101 F, pus (white drainage) or increased drainage or redness at the wound, or calf pain, call your surgeon's office.   Change dressing  As directed    Comments:     Change dressing on wednesday, then change the dressing daily with sterile 4 x 4 inch gauze dressing and apply TED hose.   Constipation Prevention  As directed     Comments:     Drink plenty of fluids.  Prune juice may be helpful.  You may use a stool softener, such as Colace (over the counter) 100 mg twice a day.  Use MiraLax (over the counter) for constipation as needed.   CPM  As directed    Comments:     Continuous passive motion machine (CPM):      Use the CPM from 0 to 90 for 6-8 hours per day.      You may increase by 10 per day.  You may break it up into 2 or 3 sessions per day.      Use CPM for 2 weeks or until you are told to stop.   Diet - low sodium heart healthy  As directed    Do not put a pillow under the knee. Place it under the heel.  As directed    Driving restrictions  As directed    Comments:     No driving for 6 weeks   Increase activity slowly as tolerated  As directed    Lifting restrictions  As directed    Comments:     No lifting for 6 weeks   TED hose  As directed    Comments:     Use stockings (TED hose) for 3 weeks on both leg(s).  You may remove them at night for sleeping.      DISCHARGE MEDICATIONS:     Medication List         beclomethasone 80 MCG/ACT inhaler  Commonly known as:  QVAR  Inhale 2 puffs into the lungs 3 (three) times daily as needed.     budesonide-formoterol 80-4.5 MCG/ACT inhaler  Commonly known as:  SYMBICORT  Inhale 2 puffs into the lungs 2 times daily at 12 noon and 4 pm.     calcium carbonate 600 MG Tabs tablet  Commonly known as:  OS-CAL  Take 600 mg by mouth daily.     enoxaparin 40 MG/0.4ML injection  Commonly known as:  LOVENOX  Inject 0.4 mLs (40 mg total) into the skin daily.     ferrous fumarate 325 (106 FE) MG Tabs tablet  Commonly known as:  HEMOCYTE - 106 mg FE  Take 1 tablet by mouth daily.     GOODYS PM PO  Take by mouth.     hydroxypropyl methylcellulose 2.5 % ophthalmic solution  Commonly  known as:  ISOPTO TEARS  Place 1 drop into both eyes daily as needed for dry eyes.     ibuprofen 200 MG tablet  Commonly known as:  ADVIL,MOTRIN  Take 200 mg by mouth 3  (three) times daily. Take 3 times every morning to get moving per patient     methocarbamol 500 MG tablet  Commonly known as:  ROBAXIN  Take 1-2 tablets (500-1,000 mg total) by mouth every 6 (six) hours as needed for muscle spasms.     omalizumab 150 MG injection  Commonly known as:  XOLAIR  Inject into the skin every 30 (thirty) days.     omega-3 acid ethyl esters 1 G capsule  Commonly known as:  LOVAZA  Take 1 g by mouth daily.     ondansetron 4 MG tablet  Commonly known as:  ZOFRAN  Take 1 tablet (4 mg total) by mouth every 6 (six) hours as needed for nausea.     oxyCODONE 5 MG immediate release tablet  Commonly known as:  Oxy IR/ROXICODONE  Take 1-2 tablets (5-10 mg total) by mouth every 4 (four) hours as needed for breakthrough pain.     OxyCODONE 10 mg T12a 12 hr tablet  Commonly known as:  OXYCONTIN  Take 1 tablet (10 mg total) by mouth every 12 (twelve) hours.     PREVACID PO  Take 1 tablet by mouth 2 (two) times daily.     SINGULAIR 10 MG tablet  Generic drug:  montelukast  Take 10 mg by mouth daily.     valsartan-hydrochlorothiazide 160-12.5 MG per tablet  Commonly known as:  DIOVAN-HCT  Take 1 tablet by mouth daily.     vitamin E 400 UNIT capsule  Generic drug:  vitamin E  Take 400 Units by mouth 2 (two) times daily.     white petrolatum-zinc oxide cream  Apply topically as needed.        FOLLOW UP VISIT:       Follow-up Information   Follow up with Rudean Haskell, MD. Call on 02/04/2014.   Specialty:  Orthopedic Surgery   Contact information:   [place] [phone]       DISPOSITION: HOME  CONDITION:  Good   Sandra Suarez 01/22/2014, 12:30 PM

## 2014-09-21 HISTORY — PX: COLONOSCOPY: SHX174

## 2015-04-03 IMAGING — CR DG CHEST 2V
2 series · 2 of 2 positions shown · non-contrast
Comparison: None.

CLINICAL DATA: Preop left total knee arthroplasty.

EXAM:
CHEST  2 VIEW

[w chest pa]
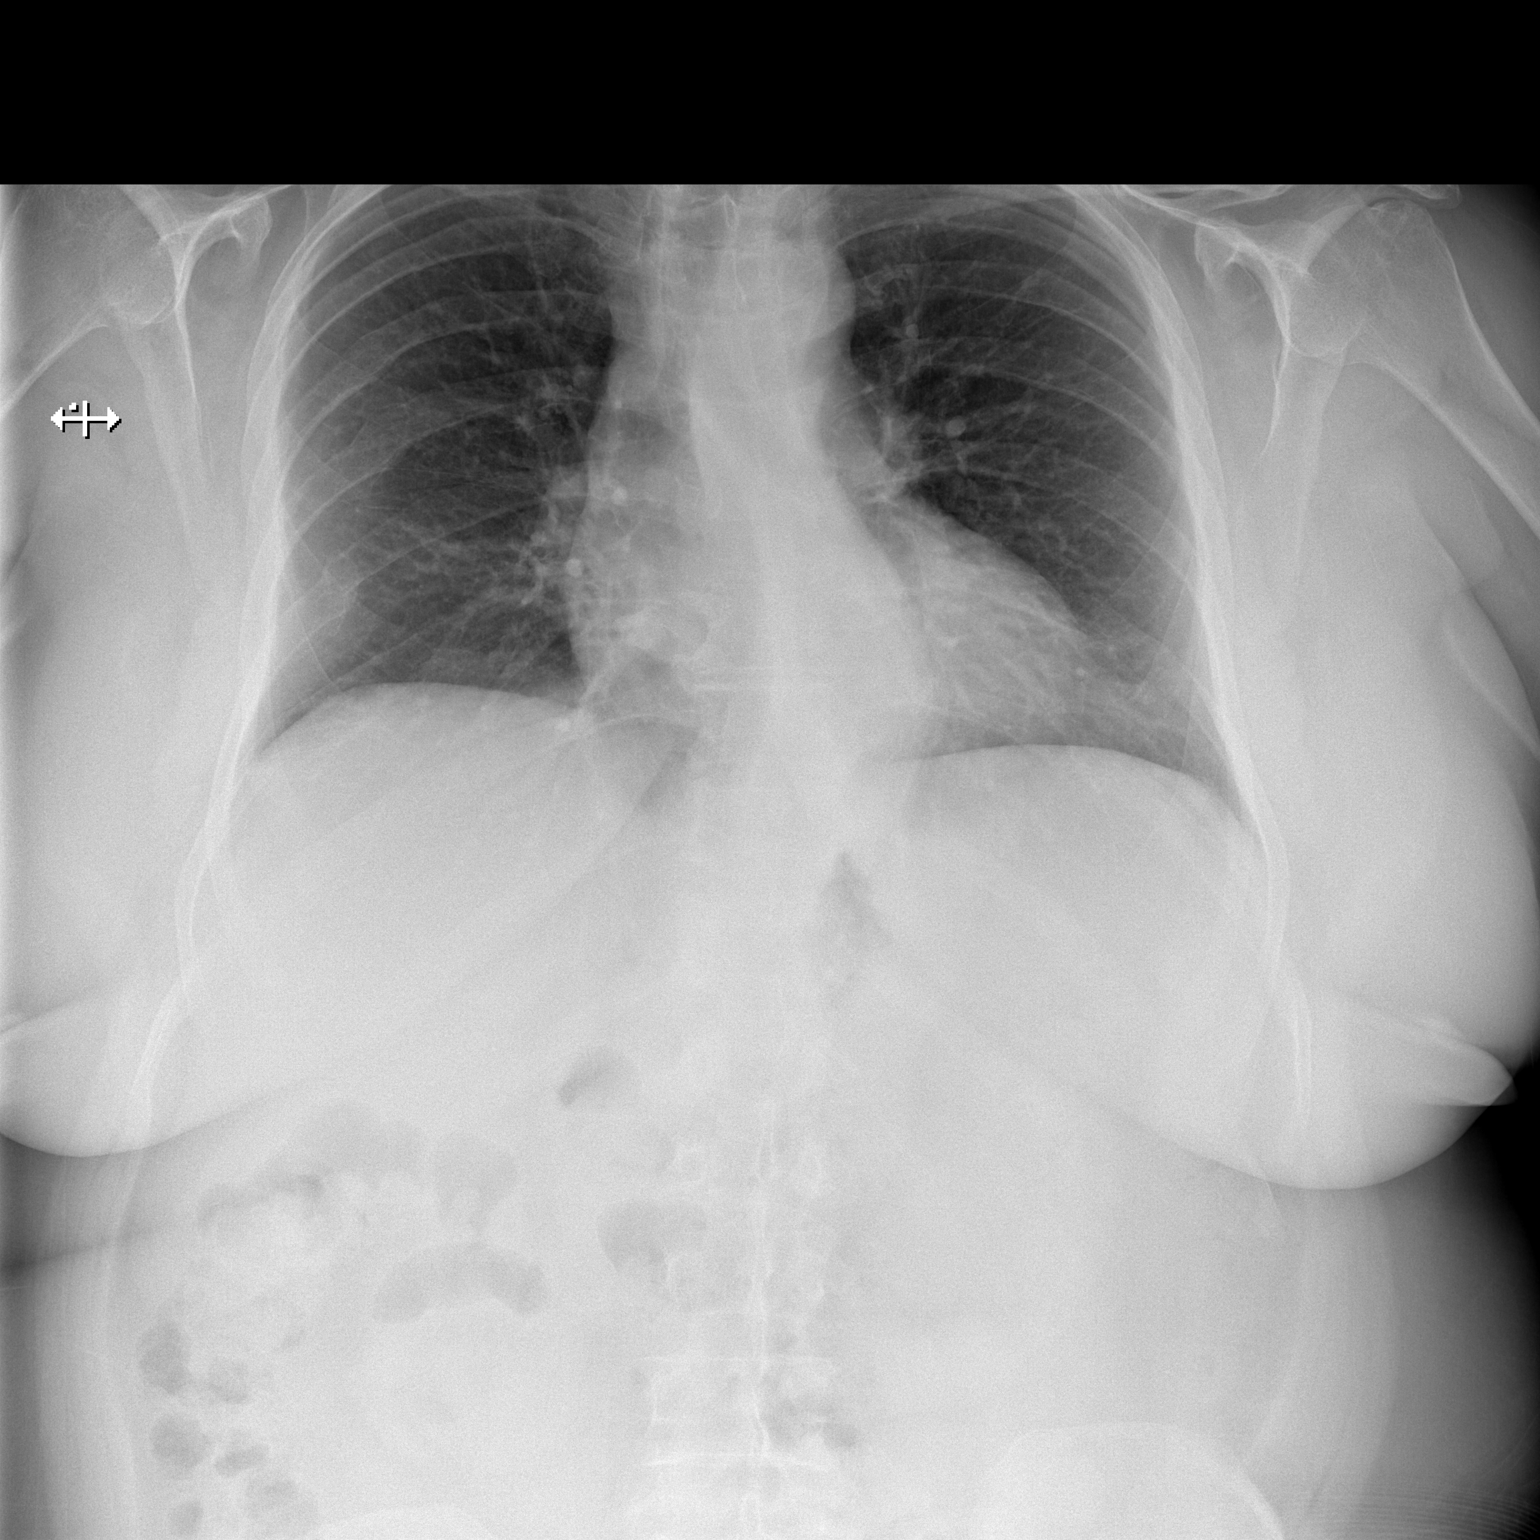

[w chest lat]
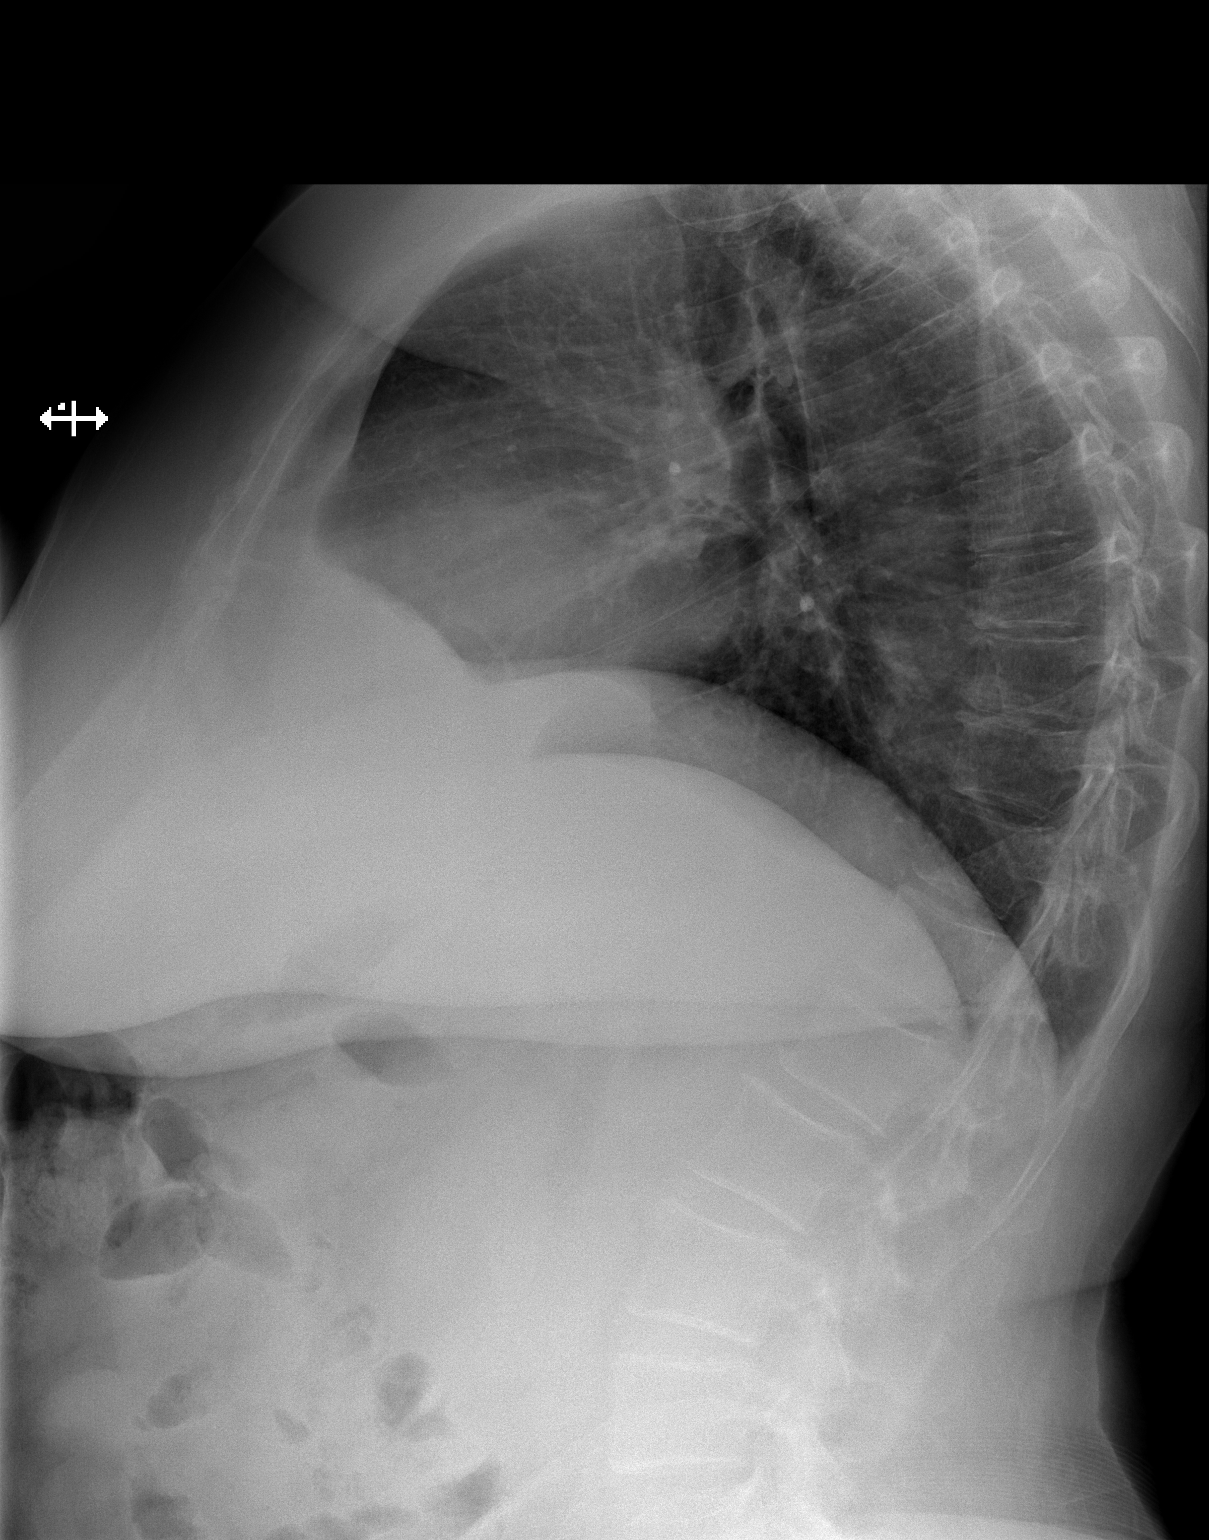

[2 of 2 positions shown; findings below may reference images not displayed]

FINDINGS: Trachea is midline. Heart size within normal limits. Descending
thoracic aorta is tortuous. Lungs are clear. No pleural fluid. Old
right rib fractures.
IMPRESSION: No acute findings.

## 2015-07-24 ENCOUNTER — Encounter: Payer: Self-pay | Admitting: *Deleted

## 2015-07-24 DIAGNOSIS — H1010 Acute atopic conjunctivitis, unspecified eye: Secondary | ICD-10-CM | POA: Insufficient documentation

## 2015-07-24 DIAGNOSIS — K219 Gastro-esophageal reflux disease without esophagitis: Secondary | ICD-10-CM | POA: Insufficient documentation

## 2015-07-24 DIAGNOSIS — J4540 Moderate persistent asthma, uncomplicated: Secondary | ICD-10-CM | POA: Insufficient documentation

## 2015-07-24 DIAGNOSIS — J309 Allergic rhinitis, unspecified: Principal | ICD-10-CM

## 2015-07-31 ENCOUNTER — Other Ambulatory Visit: Payer: Self-pay | Admitting: *Deleted

## 2015-07-31 MED ORDER — MEPOLIZUMAB 100 MG ~~LOC~~ SOLR: 100.0000 mg | SUBCUTANEOUS | Status: AC

## 2015-10-28 ENCOUNTER — Other Ambulatory Visit: Payer: Self-pay | Admitting: Allergy and Immunology

## 2015-11-09 ENCOUNTER — Other Ambulatory Visit: Payer: Self-pay | Admitting: Allergy and Immunology

## 2015-12-10 ENCOUNTER — Ambulatory Visit: Payer: BLUE CROSS/BLUE SHIELD | Admitting: Allergy and Immunology

## 2015-12-25 ENCOUNTER — Encounter: Payer: Self-pay | Admitting: Allergy and Immunology

## 2015-12-25 ENCOUNTER — Ambulatory Visit (INDEPENDENT_AMBULATORY_CARE_PROVIDER_SITE_OTHER): Payer: BLUE CROSS/BLUE SHIELD | Admitting: Allergy and Immunology

## 2015-12-25 VITALS — BP 132/86 | HR 80 | Resp 18 | Ht 61.42 in | Wt 188.3 lb

## 2015-12-25 DIAGNOSIS — J4540 Moderate persistent asthma, uncomplicated: Secondary | ICD-10-CM

## 2015-12-25 DIAGNOSIS — J309 Allergic rhinitis, unspecified: Secondary | ICD-10-CM

## 2015-12-25 DIAGNOSIS — H1010 Acute atopic conjunctivitis, unspecified eye: Secondary | ICD-10-CM | POA: Diagnosis not present

## 2015-12-25 MED ORDER — ALBUTEROL SULFATE HFA 108 (90 BASE) MCG/ACT IN AERS: 2.0000 | INHALATION_SPRAY | RESPIRATORY_TRACT | Status: AC | PRN

## 2015-12-25 MED ORDER — LANSOPRAZOLE 30 MG PO CPDR: 30.0000 mg | DELAYED_RELEASE_CAPSULE | Freq: Two times a day (BID) | ORAL | Status: AC

## 2015-12-25 MED ORDER — MONTELUKAST SODIUM 10 MG PO TABS: 10.0000 mg | ORAL_TABLET | Freq: Every day | ORAL | Status: DC

## 2015-12-25 MED ORDER — BUDESONIDE-FORMOTEROL FUMARATE 80-4.5 MCG/ACT IN AERO: 2.0000 | INHALATION_SPRAY | Freq: Two times a day (BID) | RESPIRATORY_TRACT | Status: DC

## 2015-12-25 NOTE — Patient Instructions (Signed)
  1. Over-the-counter Rhinocort one spray each nostril one time per day. Sample. Coupon   2. Symbicort 160- 2 inhalations twice a day   3. Montelukast 10 mg daily  4. Prevacid 30 mg twice a day  5. Ventolin HFA 2 puffs every 4-6 hours if needed  6. Over-the-counter antihistamine if needed  7. Return to clinic in 1 year or earlier if problem

## 2015-12-25 NOTE — Progress Notes (Signed)
Follow-up Note  Referring Provider: Ronita Hipps, MD Primary Provider: Ronita Hipps, MD Date of Office Visit: 12/25/2015  Subjective:   Sandra Suarez is a 60 y.o. female who returns to the [facility] on 12/25/2015 in re-evaluation of the following:  HPI Comments: Sandra Suarez returns to this clinic in evaluation of her asthma and allergic rhinoconjunctivitis and LPR. I last saw her in his clinic in March 2016  Sandra Suarez has had very good control of her asthma. She stopped her Symbicort and only uses this medication about once over the course the past 3 months. She is no longer on Xolair. She does not use a short acting bronchodilator. She does not exercise any large extent. She did receive the flu vaccine and the Prevnar booster  Sandra Suarez has had very good control of the issue with her nose. She has not had any episodes of sinusitis since I've seen her in his clinic.  Sandra Suarez has reflux which is under adequate control while using Prevacid twice a day. If she misses a dose she has rather significant reflux.   Current Outpatient Prescriptions on File Prior to Visit  Medication Sig Dispense Refill  . albuterol (PROVENTIL) (2.5 MG/3ML) 0.083% nebulizer solution Take 2.5 mg by nebulization every 4 (four) hours as needed (cough or wheeze).    . budesonide-formoterol (SYMBICORT) 80-4.5 MCG/ACT inhaler Inhale 2 puffs into the lungs 2 (two) times daily as needed.     Sandra Suarez EPINEPHrine (EPIPEN 2-PAK) 0.3 mg/0.3 mL IJ SOAJ injection Inject 0.3 mg into the muscle once.    Sandra Suarez ibuprofen (ADVIL,MOTRIN) 200 MG tablet Take 200 mg by mouth 3 (three) times daily. Take 3 times every morning to get moving per patient    . Lansoprazole (PREVACID PO) Take 1 tablet by mouth 2 (two) times daily.    . montelukast (SINGULAIR) 10 MG tablet TAKE 1 TABLET ONCE DAILY. 30 tablet 0  . beclomethasone (QVAR) 80 MCG/ACT inhaler Inhale 2 puffs into the lungs 3 (three) times daily as needed. Reported on 12/25/2015    .  calcium carbonate (OS-CAL) 600 MG TABS tablet Take 600 mg by mouth daily. Reported on 12/25/2015    . Diphenhydramine-APAP, sleep, (GOODYS PM PO) Take by mouth. Reported on 12/25/2015    . hydroxypropyl methylcellulose (ISOPTO TEARS) 2.5 % ophthalmic solution Place 1 drop into both eyes daily as needed for dry eyes. Reported on 12/25/2015    . omega-3 acid ethyl esters (LOVAZA) 1 G capsule Take 1 g by mouth daily. Reported on 12/25/2015    . vitamin E (VITAMIN E) 400 UNIT capsule Take 400 Units by mouth 2 (two) times daily. Reported on 12/25/2015     Current Facility-Administered Medications on File Prior to Visit  Medication Dose Route Frequency Provider Last Rate Last Dose  . Mepolizumab SOLR 100 mg  100 mg Subcutaneous Q28 days Jiles Prows, MD        No orders of the defined types were placed in this encounter.    Past Medical History  Diagnosis Date  . PONV (postoperative nausea and vomiting)     none recently  . Hypertension   . Asthma   . GERD (gastroesophageal reflux disease)   . Arthritis   . Anemia   . Colon polyps 2015    Past Surgical History  Procedure Laterality Date  . Shoulder arthroscopy Bilateral 05,09    rotator cuff  . Knee arthroscopy Right   . Leg surgery Right 79  fx   . Fracture surgery Left 07  . Eye surgery Left 97    tumor -plate  . Tubal ligation  78  . Abdominal hysterectomy    . Breast surgery Right     cyst x3  . Total knee arthroplasty Left 01/20/2014    Procedure: TOTAL KNEE ARTHROPLASTY;  Surgeon: Sandra Huger, MD;  Location: [facility];  Service: Orthopedics;  Laterality: Left;    No Known Allergies  Review of systems negative except as noted in HPI / PMHx or noted below:  Review of Systems  Constitutional: Negative.   HENT: Negative.   Eyes: Negative.   Respiratory: Negative.   Cardiovascular: Negative.   Gastrointestinal: Negative.   Genitourinary: Negative.   Musculoskeletal: Negative.   Skin: Negative.   Neurological: Negative.     Endo/Heme/Allergies: Negative.   Psychiatric/Behavioral: Negative.      Objective:   Filed Vitals:   12/25/15 0856  BP: 132/86  Pulse: 80  Resp: 18   Height: 5' 1.42" (156 cm)  Weight: 188 lb 4.4 oz (85.4 kg)   Physical Exam  Constitutional: She is well-developed, well-nourished, and in no distress.  HENT:  Head: Normocephalic.  Right Ear: Tympanic membrane, external ear and ear canal normal.  Left Ear: Tympanic membrane, external ear and ear canal normal.  Nose: Nose normal. No mucosal edema or rhinorrhea.  Mouth/Throat: Uvula is midline, oropharynx is clear and moist and mucous membranes are normal. No oropharyngeal exudate.  Eyes: Conjunctivae are normal.  Neck: Trachea normal. No tracheal tenderness present. No tracheal deviation present. No thyromegaly present.  Cardiovascular: Normal rate, regular rhythm, S1 normal, S2 normal and normal heart sounds.   No murmur heard. Pulmonary/Chest: Breath sounds normal. No stridor. No respiratory distress. She has no wheezes. She has no rales.  Musculoskeletal: She exhibits no edema.  Lymphadenopathy:       Head (right side): No tonsillar adenopathy present.       Head (left side): No tonsillar adenopathy present.    She has no cervical adenopathy.    She has no axillary adenopathy.  Neurological: She is alert. Gait normal.  Skin: No rash noted. She is not diaphoretic. No erythema. Nails show no clubbing.  Psychiatric: Mood and affect normal.    Diagnostics:    Spirometry was performed and demonstrated an FEV1 of 1.98 at 83 % of predicted.  The patient had an Asthma Control Test with the following results: ACT Total Score: 24.    Assessment and Plan:   1. Asthma, moderate persistent, well-controlled   2. Allergic rhinoconjunctivitis     1. Over-the-counter Rhinocort one spray each nostril one time per day. Sample. Coupon   2. Symbicort 160- 2 inhalations twice a day   3. Montelukast 10 mg daily  4. Prevacid 30 mg  twice a day  5. Ventolin HFA 2 puffs every 4-6 hours if needed  6. Over-the-counter antihistamine if needed  7. Return to clinic in 1 year or earlier if problem   Chandy is doing quite well and I think we just need to prepare for this upcoming spring season that she is very atopic. I've encouraged her to restart her Symbicort and some Rhinocort in attempt to prevent her from developing significant inflammation of her respiratory tract once the pollen season arrives. As well, she needs to continue on Prevacid twice a day for when she uses it once a day she develop significant reflux. She does well I will see her back in this clinic in  1 year or earlier if there is a problem.  Allena Katz, MD [facility]

## 2016-03-17 DIAGNOSIS — H5203 Hypermetropia, bilateral: Secondary | ICD-10-CM | POA: Diagnosis not present

## 2016-05-12 DIAGNOSIS — M25561 Pain in right knee: Secondary | ICD-10-CM | POA: Diagnosis not present

## 2016-05-12 DIAGNOSIS — Z96652 Presence of left artificial knee joint: Secondary | ICD-10-CM | POA: Diagnosis not present

## 2016-05-12 DIAGNOSIS — M1711 Unilateral primary osteoarthritis, right knee: Secondary | ICD-10-CM | POA: Diagnosis not present

## 2016-05-30 DIAGNOSIS — H10023 Other mucopurulent conjunctivitis, bilateral: Secondary | ICD-10-CM | POA: Diagnosis not present

## 2016-07-19 DIAGNOSIS — K6282 Dysplasia of anus: Secondary | ICD-10-CM | POA: Diagnosis not present

## 2016-07-19 DIAGNOSIS — K7210 Chronic hepatic failure without coma: Secondary | ICD-10-CM | POA: Diagnosis not present

## 2016-07-27 DIAGNOSIS — D128 Benign neoplasm of rectum: Secondary | ICD-10-CM | POA: Diagnosis not present

## 2016-07-27 DIAGNOSIS — K7210 Chronic hepatic failure without coma: Secondary | ICD-10-CM | POA: Diagnosis not present

## 2016-07-27 DIAGNOSIS — K581 Irritable bowel syndrome with constipation: Secondary | ICD-10-CM | POA: Diagnosis not present

## 2016-07-27 DIAGNOSIS — Z8371 Family history of colonic polyps: Secondary | ICD-10-CM | POA: Diagnosis not present

## 2016-07-27 DIAGNOSIS — J45909 Unspecified asthma, uncomplicated: Secondary | ICD-10-CM | POA: Diagnosis not present

## 2016-07-27 DIAGNOSIS — Z79899 Other long term (current) drug therapy: Secondary | ICD-10-CM | POA: Diagnosis not present

## 2016-07-27 DIAGNOSIS — I10 Essential (primary) hypertension: Secondary | ICD-10-CM | POA: Diagnosis not present

## 2016-07-27 DIAGNOSIS — K621 Rectal polyp: Secondary | ICD-10-CM | POA: Diagnosis not present

## 2016-07-27 DIAGNOSIS — Z86010 Personal history of colonic polyps: Secondary | ICD-10-CM | POA: Diagnosis not present

## 2016-07-27 DIAGNOSIS — K219 Gastro-esophageal reflux disease without esophagitis: Secondary | ICD-10-CM | POA: Diagnosis not present

## 2016-07-27 DIAGNOSIS — K6282 Dysplasia of anus: Secondary | ICD-10-CM | POA: Diagnosis not present

## 2016-07-27 DIAGNOSIS — Z85048 Personal history of other malignant neoplasm of rectum, rectosigmoid junction, and anus: Secondary | ICD-10-CM | POA: Diagnosis not present

## 2016-07-27 DIAGNOSIS — D127 Benign neoplasm of rectosigmoid junction: Secondary | ICD-10-CM | POA: Diagnosis not present

## 2016-08-04 DIAGNOSIS — M1711 Unilateral primary osteoarthritis, right knee: Secondary | ICD-10-CM | POA: Diagnosis not present

## 2016-08-30 DIAGNOSIS — Z23 Encounter for immunization: Secondary | ICD-10-CM | POA: Diagnosis not present

## 2016-09-16 DIAGNOSIS — Z96659 Presence of unspecified artificial knee joint: Secondary | ICD-10-CM | POA: Diagnosis not present

## 2016-09-21 DIAGNOSIS — L820 Inflamed seborrheic keratosis: Secondary | ICD-10-CM | POA: Diagnosis not present

## 2016-09-21 DIAGNOSIS — Z1389 Encounter for screening for other disorder: Secondary | ICD-10-CM | POA: Diagnosis not present

## 2016-09-21 DIAGNOSIS — Z01818 Encounter for other preprocedural examination: Secondary | ICD-10-CM | POA: Diagnosis not present

## 2016-09-21 DIAGNOSIS — D1801 Hemangioma of skin and subcutaneous tissue: Secondary | ICD-10-CM | POA: Diagnosis not present

## 2016-11-21 HISTORY — PX: TOTAL KNEE ARTHROPLASTY: SHX125

## 2016-11-29 ENCOUNTER — Ambulatory Visit (INDEPENDENT_AMBULATORY_CARE_PROVIDER_SITE_OTHER): Payer: BLUE CROSS/BLUE SHIELD | Admitting: Allergy

## 2016-11-29 ENCOUNTER — Encounter: Payer: Self-pay | Admitting: Allergy

## 2016-11-29 VITALS — BP 112/72 | HR 76 | Resp 18

## 2016-11-29 DIAGNOSIS — H1010 Acute atopic conjunctivitis, unspecified eye: Secondary | ICD-10-CM | POA: Diagnosis not present

## 2016-11-29 DIAGNOSIS — K219 Gastro-esophageal reflux disease without esophagitis: Secondary | ICD-10-CM | POA: Diagnosis not present

## 2016-11-29 DIAGNOSIS — J309 Allergic rhinitis, unspecified: Secondary | ICD-10-CM

## 2016-11-29 DIAGNOSIS — J4541 Moderate persistent asthma with (acute) exacerbation: Secondary | ICD-10-CM

## 2016-11-29 MED ORDER — PREDNISONE 10 MG PO TABS: ORAL_TABLET | ORAL | 0 refills | Status: DC

## 2016-11-29 MED ORDER — PREDNISONE 1 MG PO TABS: 20.0000 mg | ORAL_TABLET | Freq: Once | ORAL | Status: AC

## 2016-11-29 NOTE — Progress Notes (Signed)
Follow-up Note  RE: Sandra Suarez MRN: [id number] DOB: October 18, 1956 Date of Office Visit: 11/29/2016   History of present illness: Sandra Suarez is a 61 y.o. female presenting today for follow-up/sick visit. She was last seen in the office by Dr. Neldon Mc on 12/25/2015 for asthma and allergies. She states once a year she usually has a flare of her asthma. However for the last 4 months she has had more wheezing that has been intermittent.   Last week she developed likely viral illness with worsening nasal drainage, cough and wheeze.  She has been around several sick contacts with her sister having PNA and grandchild with flu.  She has been taking mucinex and advil.  Mucinex has been helpful but she can tell when it wears off after 12 hours she has more cough and wheezing.  She is having some sputum which is thin and clear.  She had a fever up to 102 over the weekend.  She has Symbicort that she uses "when she needs it".  She will use her symbicort if she is "breathing bad" or wheezing more.  She did start using it this past weekend.   She does not use albuterol often and does not remember the last time she used albuterol.  She does take singulair, prevacid on daily basis.   Her allergy symptoms are well controlled with singulair.            Review of systems: Review of Systems  Constitutional: Positive for fever and malaise/fatigue. Negative for chills.  HENT: Positive for congestion. Negative for ear pain, nosebleeds, sinus pain and sore throat.   Eyes: Negative for discharge and redness.  Respiratory: Positive for cough, sputum production, shortness of breath and wheezing.   Cardiovascular: Negative for chest pain.  Gastrointestinal: Negative for abdominal pain, heartburn, nausea and vomiting.  Musculoskeletal: Negative for myalgias.  Skin: Negative for itching and rash (he was).    All other systems negative unless noted above in HPI  Past medical/social/surgical/family history have  been reviewed and are unchanged unless specifically indicated below.  No changes  Medication List: Allergies as of 11/29/2016   No Known Allergies     Medication List       Accurate as of 11/29/16 12:11 PM. Always use your most recent med list.          ADVIL PM 200-25 MG Caps Generic drug:  Ibuprofen-Diphenhydramine HCl Take 2 tablets by mouth at bedtime.   albuterol (2.5 MG/3ML) 0.083% nebulizer solution Commonly known as:  PROVENTIL Take 2.5 mg by nebulization every 4 (four) hours as needed (cough or wheeze).   albuterol 108 (90 Base) MCG/ACT inhaler Commonly known as:  VENTOLIN HFA Inhale 2 puffs into the lungs every 4 (four) hours as needed for wheezing or shortness of breath.   budesonide-formoterol 80-4.5 MCG/ACT inhaler Commonly known as:  SYMBICORT Inhale 2 puffs into the lungs 2 (two) times daily. Dispense 90 days supply   EPIPEN 2-PAK 0.3 mg/0.3 mL Soaj injection Generic drug:  EPINEPHrine Inject 0.3 mg into the muscle once.   ibuprofen 200 MG tablet Commonly known as:  ADVIL,MOTRIN Take 200 mg by mouth 3 (three) times daily. Take 3 times every morning to get moving per patient   lansoprazole 30 MG capsule Commonly known as:  PREVACID Take 1 capsule (30 mg total) by mouth 2 (two) times daily.   montelukast 10 MG tablet Commonly known as:  SINGULAIR Take 1 tablet (10 mg total) by mouth daily.  multivitamin tablet Take 1 tablet by mouth daily.   predniSONE 10 MG tablet Commonly known as:  DELTASONE Take two tablets twice daily for 5 days   valsartan 80 MG tablet Commonly known as:  DIOVAN Take 80 mg by mouth daily.   vitamin E 400 UNIT capsule Generic drug:  vitamin E Take 400 Units by mouth 2 (two) times daily. Reported on 12/25/2015       Known medication allergies: No Known Allergies   Physical examination: Blood pressure 112/72, pulse 76, resp. rate 18.  General: Alert, interactive, in no acute distress. HEENT: TMs pearly gray,  turbinates mildly edematous with clear discharge, post-pharynx non erythematous. Neck: Supple without lymphadenopathy. Lungs: Decreased breath sounds with expiratory wheezing bilaterally. {no increased work of breathing. Improved aeration and decreased wheezing following bronchodilator CV: Normal S1, S2 without murmurs. Abdomen: Nondistended, nontender. Skin: Warm and dry, without lesions or rashes. Extremities:  No clubbing, cyanosis or edema. Neuro:   Grossly intact.  Diagnositics/Labs: Spirometry: FEV1: 1.27L  56%, FVC: 1.72L  58%,  postbronchodilator she had slight improvement in FEV1 to 1.32L   Assessment and plan:    Moderate persistent asthma with acute exacerbation  - Symbicort 160- 2 inhalations twice a day.  Take every day during winter season  - Take prednisone 20mg  (2 tablets) twice a day x 5 day  - Montelukast 10 mg daily  - Ventolin HFA 2 puffs every 4-6 hours if needed Asthma control goals:   Full participation in all desired activities (may need albuterol before activity)  Albuterol use two time or less a week on average (not counting use with activity)  Cough interfering with sleep two time or less a month  Oral steroids no more than once a year  No hospitalizations  Allergic rhinoconjunctivitis  - Over-the-counter Rhinocort 1-2 spray each nostril one time per day as needed for nasal congestion  - Over-the-counter antihistamine if needed  Reflux  -  Prevacid 30 mg twice a day   Return to clinic in 3-4 months or earlier if problem    I appreciate the opportunity to take part in Sandra Suarez's care. Please do not hesitate to contact me with questions.  Sincerely,   Prudy Feeler, MD Allergy/Immunology [facility]

## 2016-11-29 NOTE — Patient Instructions (Addendum)
  1. Over-the-counter Rhinocort 1-2 spray each nostril one time per day as needed for nasal congestion  2. Symbicort 160- 2 inhalations twice a day.  Take every day during winter season  3.  Take prednisone 20mg  (2 tablets) twice a day x 5 day  4. Montelukast 10 mg daily  5. Prevacid 30 mg twice a day  6. Ventolin HFA 2 puffs every 4-6 hours if needed  7. Over-the-counter antihistamine if needed  8. Return to clinic in 3-4 months or earlier if problem

## 2016-12-12 DIAGNOSIS — Z0000 Encounter for general adult medical examination without abnormal findings: Secondary | ICD-10-CM | POA: Diagnosis not present

## 2016-12-13 DIAGNOSIS — R0989 Other specified symptoms and signs involving the circulatory and respiratory systems: Secondary | ICD-10-CM | POA: Diagnosis not present

## 2016-12-29 DIAGNOSIS — Z1231 Encounter for screening mammogram for malignant neoplasm of breast: Secondary | ICD-10-CM | POA: Diagnosis not present

## 2017-01-16 ENCOUNTER — Telehealth: Payer: Self-pay

## 2017-01-16 NOTE — Telephone Encounter (Signed)
Error

## 2017-01-30 DIAGNOSIS — M94261 Chondromalacia, right knee: Secondary | ICD-10-CM | POA: Diagnosis not present

## 2017-01-30 DIAGNOSIS — Z96651 Presence of right artificial knee joint: Secondary | ICD-10-CM | POA: Diagnosis not present

## 2017-01-30 DIAGNOSIS — M179 Osteoarthritis of knee, unspecified: Secondary | ICD-10-CM | POA: Diagnosis not present

## 2017-01-30 DIAGNOSIS — M1711 Unilateral primary osteoarthritis, right knee: Secondary | ICD-10-CM | POA: Diagnosis not present

## 2017-02-03 DIAGNOSIS — M1711 Unilateral primary osteoarthritis, right knee: Secondary | ICD-10-CM | POA: Diagnosis not present

## 2017-02-06 DIAGNOSIS — M1711 Unilateral primary osteoarthritis, right knee: Secondary | ICD-10-CM | POA: Diagnosis not present

## 2017-02-08 DIAGNOSIS — M1711 Unilateral primary osteoarthritis, right knee: Secondary | ICD-10-CM | POA: Diagnosis not present

## 2017-02-09 DIAGNOSIS — M25561 Pain in right knee: Secondary | ICD-10-CM | POA: Diagnosis not present

## 2017-02-09 DIAGNOSIS — Z96651 Presence of right artificial knee joint: Secondary | ICD-10-CM | POA: Diagnosis not present

## 2017-02-10 DIAGNOSIS — M1711 Unilateral primary osteoarthritis, right knee: Secondary | ICD-10-CM | POA: Diagnosis not present

## 2017-02-13 DIAGNOSIS — M1711 Unilateral primary osteoarthritis, right knee: Secondary | ICD-10-CM | POA: Diagnosis not present

## 2017-02-15 DIAGNOSIS — M1711 Unilateral primary osteoarthritis, right knee: Secondary | ICD-10-CM | POA: Diagnosis not present

## 2017-02-16 DIAGNOSIS — M1711 Unilateral primary osteoarthritis, right knee: Secondary | ICD-10-CM | POA: Diagnosis not present

## 2017-02-21 DIAGNOSIS — M1711 Unilateral primary osteoarthritis, right knee: Secondary | ICD-10-CM | POA: Diagnosis not present

## 2017-02-23 DIAGNOSIS — M1711 Unilateral primary osteoarthritis, right knee: Secondary | ICD-10-CM | POA: Diagnosis not present

## 2017-02-27 DIAGNOSIS — M1711 Unilateral primary osteoarthritis, right knee: Secondary | ICD-10-CM | POA: Diagnosis not present

## 2017-03-02 DIAGNOSIS — M1711 Unilateral primary osteoarthritis, right knee: Secondary | ICD-10-CM | POA: Diagnosis not present

## 2017-03-04 ENCOUNTER — Other Ambulatory Visit: Payer: Self-pay | Admitting: Allergy and Immunology

## 2017-03-06 DIAGNOSIS — M1711 Unilateral primary osteoarthritis, right knee: Secondary | ICD-10-CM | POA: Diagnosis not present

## 2017-03-09 DIAGNOSIS — M1711 Unilateral primary osteoarthritis, right knee: Secondary | ICD-10-CM | POA: Diagnosis not present

## 2017-03-13 DIAGNOSIS — M1711 Unilateral primary osteoarthritis, right knee: Secondary | ICD-10-CM | POA: Diagnosis not present

## 2017-03-16 DIAGNOSIS — M1711 Unilateral primary osteoarthritis, right knee: Secondary | ICD-10-CM | POA: Diagnosis not present

## 2017-03-20 DIAGNOSIS — M1711 Unilateral primary osteoarthritis, right knee: Secondary | ICD-10-CM | POA: Diagnosis not present

## 2017-04-05 DIAGNOSIS — K6282 Dysplasia of anus: Secondary | ICD-10-CM | POA: Diagnosis not present

## 2017-04-05 DIAGNOSIS — K648 Other hemorrhoids: Secondary | ICD-10-CM | POA: Diagnosis not present

## 2017-04-05 DIAGNOSIS — J45909 Unspecified asthma, uncomplicated: Secondary | ICD-10-CM | POA: Diagnosis not present

## 2017-04-05 DIAGNOSIS — Z9889 Other specified postprocedural states: Secondary | ICD-10-CM | POA: Diagnosis not present

## 2017-04-05 DIAGNOSIS — K219 Gastro-esophageal reflux disease without esophagitis: Secondary | ICD-10-CM | POA: Diagnosis not present

## 2017-04-05 DIAGNOSIS — I10 Essential (primary) hypertension: Secondary | ICD-10-CM | POA: Diagnosis not present

## 2017-04-05 DIAGNOSIS — K649 Unspecified hemorrhoids: Secondary | ICD-10-CM | POA: Diagnosis not present

## 2017-04-18 DIAGNOSIS — H5203 Hypermetropia, bilateral: Secondary | ICD-10-CM | POA: Diagnosis not present

## 2017-05-05 ENCOUNTER — Other Ambulatory Visit: Payer: Self-pay | Admitting: Allergy and Immunology

## 2017-08-08 DIAGNOSIS — Z23 Encounter for immunization: Secondary | ICD-10-CM | POA: Diagnosis not present

## 2017-09-04 ENCOUNTER — Other Ambulatory Visit: Payer: Self-pay | Admitting: Allergy and Immunology

## 2017-09-21 DIAGNOSIS — L821 Other seborrheic keratosis: Secondary | ICD-10-CM | POA: Diagnosis not present

## 2017-09-21 DIAGNOSIS — L814 Other melanin hyperpigmentation: Secondary | ICD-10-CM | POA: Diagnosis not present

## 2017-09-21 DIAGNOSIS — Z96651 Presence of right artificial knee joint: Secondary | ICD-10-CM | POA: Diagnosis not present

## 2017-09-21 DIAGNOSIS — L578 Other skin changes due to chronic exposure to nonionizing radiation: Secondary | ICD-10-CM | POA: Diagnosis not present

## 2017-09-21 DIAGNOSIS — Z471 Aftercare following joint replacement surgery: Secondary | ICD-10-CM | POA: Diagnosis not present

## 2017-09-21 DIAGNOSIS — L853 Xerosis cutis: Secondary | ICD-10-CM | POA: Diagnosis not present

## 2018-01-01 DIAGNOSIS — Z1331 Encounter for screening for depression: Secondary | ICD-10-CM | POA: Diagnosis not present

## 2018-01-01 DIAGNOSIS — Z0000 Encounter for general adult medical examination without abnormal findings: Secondary | ICD-10-CM | POA: Diagnosis not present

## 2018-01-01 DIAGNOSIS — Z13220 Encounter for screening for lipoid disorders: Secondary | ICD-10-CM | POA: Diagnosis not present

## 2018-01-01 DIAGNOSIS — Z6838 Body mass index (BMI) 38.0-38.9, adult: Secondary | ICD-10-CM | POA: Diagnosis not present

## 2018-01-01 DIAGNOSIS — Z1339 Encounter for screening examination for other mental health and behavioral disorders: Secondary | ICD-10-CM | POA: Diagnosis not present

## 2018-02-07 DIAGNOSIS — Z1231 Encounter for screening mammogram for malignant neoplasm of breast: Secondary | ICD-10-CM | POA: Diagnosis not present

## 2018-02-13 DIAGNOSIS — Z96651 Presence of right artificial knee joint: Secondary | ICD-10-CM | POA: Diagnosis not present

## 2018-02-13 DIAGNOSIS — Z471 Aftercare following joint replacement surgery: Secondary | ICD-10-CM | POA: Diagnosis not present

## 2018-02-20 DIAGNOSIS — M79652 Pain in left thigh: Secondary | ICD-10-CM | POA: Diagnosis not present

## 2018-02-20 DIAGNOSIS — R110 Nausea: Secondary | ICD-10-CM | POA: Diagnosis not present

## 2018-02-20 DIAGNOSIS — R410 Disorientation, unspecified: Secondary | ICD-10-CM | POA: Diagnosis not present

## 2018-02-20 DIAGNOSIS — S0181XA Laceration without foreign body of other part of head, initial encounter: Secondary | ICD-10-CM | POA: Diagnosis not present

## 2018-02-20 DIAGNOSIS — R4182 Altered mental status, unspecified: Secondary | ICD-10-CM | POA: Diagnosis not present

## 2018-02-20 DIAGNOSIS — I10 Essential (primary) hypertension: Secondary | ICD-10-CM | POA: Diagnosis not present

## 2018-02-20 DIAGNOSIS — S0191XA Laceration without foreign body of unspecified part of head, initial encounter: Secondary | ICD-10-CM | POA: Diagnosis not present

## 2018-02-20 DIAGNOSIS — R51 Headache: Secondary | ICD-10-CM | POA: Diagnosis not present

## 2018-02-20 DIAGNOSIS — S0102XA Laceration with foreign body of scalp, initial encounter: Secondary | ICD-10-CM | POA: Diagnosis not present

## 2018-02-20 DIAGNOSIS — S8991XA Unspecified injury of right lower leg, initial encounter: Secondary | ICD-10-CM | POA: Diagnosis not present

## 2018-02-20 DIAGNOSIS — M79661 Pain in right lower leg: Secondary | ICD-10-CM | POA: Diagnosis not present

## 2018-02-20 DIAGNOSIS — S79922A Unspecified injury of left thigh, initial encounter: Secondary | ICD-10-CM | POA: Diagnosis not present

## 2018-02-20 DIAGNOSIS — R05 Cough: Secondary | ICD-10-CM | POA: Diagnosis not present

## 2018-03-22 DIAGNOSIS — S7010XD Contusion of unspecified thigh, subsequent encounter: Secondary | ICD-10-CM | POA: Diagnosis not present

## 2018-04-05 ENCOUNTER — Encounter: Payer: Self-pay | Admitting: Gastroenterology

## 2018-04-11 DIAGNOSIS — M79605 Pain in left leg: Secondary | ICD-10-CM | POA: Diagnosis not present

## 2018-04-11 DIAGNOSIS — T792XXD Traumatic secondary and recurrent hemorrhage and seroma, subsequent encounter: Secondary | ICD-10-CM | POA: Diagnosis not present

## 2018-04-11 DIAGNOSIS — I10 Essential (primary) hypertension: Secondary | ICD-10-CM | POA: Diagnosis not present

## 2018-04-11 DIAGNOSIS — M79604 Pain in right leg: Secondary | ICD-10-CM | POA: Diagnosis not present

## 2018-04-24 DIAGNOSIS — M79605 Pain in left leg: Secondary | ICD-10-CM | POA: Diagnosis not present

## 2018-04-24 DIAGNOSIS — M79604 Pain in right leg: Secondary | ICD-10-CM | POA: Diagnosis not present

## 2018-04-24 DIAGNOSIS — T792XXD Traumatic secondary and recurrent hemorrhage and seroma, subsequent encounter: Secondary | ICD-10-CM | POA: Diagnosis not present

## 2018-04-24 DIAGNOSIS — I10 Essential (primary) hypertension: Secondary | ICD-10-CM | POA: Diagnosis not present

## 2018-05-01 ENCOUNTER — Telehealth: Payer: Self-pay | Admitting: Gastroenterology

## 2018-05-01 ENCOUNTER — Telehealth: Payer: Self-pay

## 2018-05-01 DIAGNOSIS — T792XXD Traumatic secondary and recurrent hemorrhage and seroma, subsequent encounter: Secondary | ICD-10-CM | POA: Diagnosis not present

## 2018-05-01 DIAGNOSIS — M79605 Pain in left leg: Secondary | ICD-10-CM | POA: Diagnosis not present

## 2018-05-01 DIAGNOSIS — M79604 Pain in right leg: Secondary | ICD-10-CM | POA: Diagnosis not present

## 2018-05-01 DIAGNOSIS — I10 Essential (primary) hypertension: Secondary | ICD-10-CM | POA: Diagnosis not present

## 2018-05-01 NOTE — Telephone Encounter (Signed)
I spoke with her and she would like to hold off on the flex sig.  She thinks that she is due for a colonoscopy this year in the fall.  She wants to talk with either Nira Conn or Larena Glassman, so I did provide that number.

## 2018-05-01 NOTE — Telephone Encounter (Signed)
She is unsure if she is due a colonoscopy this year.  She did get a recall for a flex sig but would prefer to do the colonoscopy if that would be appropriate.  Please advise.  Thank you.

## 2018-05-01 NOTE — Telephone Encounter (Signed)
Patient just wanting to know if she is due for colon this year. Last colon I could find in records was on 12.29.16. Pt would like Dr.Gupta to review and advise on when she should have next colon. Pt states if it is due this year she would like it done instead of flexsig.

## 2018-05-02 ENCOUNTER — Telehealth: Payer: Self-pay

## 2018-05-02 NOTE — Telephone Encounter (Signed)
Can you please get my last colonoscopy from [facility] It will not let me login.

## 2018-05-02 NOTE — Telephone Encounter (Signed)
I printed out her last colonoscopy and flex sigs with pathology reports as available and gave them to Dr Lyndel Safe.

## 2018-05-04 DIAGNOSIS — Z01818 Encounter for other preprocedural examination: Secondary | ICD-10-CM | POA: Diagnosis not present

## 2018-05-04 DIAGNOSIS — S7010XA Contusion of unspecified thigh, initial encounter: Secondary | ICD-10-CM | POA: Diagnosis not present

## 2018-05-04 DIAGNOSIS — X58XXXA Exposure to other specified factors, initial encounter: Secondary | ICD-10-CM | POA: Diagnosis not present

## 2018-05-07 DIAGNOSIS — M1712 Unilateral primary osteoarthritis, left knee: Secondary | ICD-10-CM | POA: Diagnosis not present

## 2018-05-07 DIAGNOSIS — M7981 Nontraumatic hematoma of soft tissue: Secondary | ICD-10-CM | POA: Diagnosis not present

## 2018-05-07 DIAGNOSIS — T792XXD Traumatic secondary and recurrent hemorrhage and seroma, subsequent encounter: Secondary | ICD-10-CM | POA: Diagnosis not present

## 2018-05-07 DIAGNOSIS — S7011XA Contusion of right thigh, initial encounter: Secondary | ICD-10-CM | POA: Diagnosis not present

## 2018-05-07 DIAGNOSIS — I10 Essential (primary) hypertension: Secondary | ICD-10-CM | POA: Diagnosis not present

## 2018-05-07 DIAGNOSIS — Z01818 Encounter for other preprocedural examination: Secondary | ICD-10-CM | POA: Diagnosis not present

## 2018-05-07 DIAGNOSIS — S7011XD Contusion of right thigh, subsequent encounter: Secondary | ICD-10-CM | POA: Diagnosis not present

## 2018-05-07 DIAGNOSIS — S7012XA Contusion of left thigh, initial encounter: Secondary | ICD-10-CM | POA: Diagnosis not present

## 2018-05-07 DIAGNOSIS — S7012XD Contusion of left thigh, subsequent encounter: Secondary | ICD-10-CM | POA: Diagnosis not present

## 2018-05-07 DIAGNOSIS — T792XXA Traumatic secondary and recurrent hemorrhage and seroma, initial encounter: Secondary | ICD-10-CM | POA: Diagnosis not present

## 2018-06-19 DIAGNOSIS — Z111 Encounter for screening for respiratory tuberculosis: Secondary | ICD-10-CM | POA: Diagnosis not present

## 2018-08-02 ENCOUNTER — Telehealth: Payer: Self-pay | Admitting: Gastroenterology

## 2018-08-02 NOTE — Telephone Encounter (Signed)
Pt received recall letter for flex-sig in the mail but wants to know if he can go ahead and just have colon done. Please advise.

## 2018-08-02 NOTE — Telephone Encounter (Signed)
Would be better to get the colon done

## 2018-08-03 NOTE — Telephone Encounter (Signed)
Pt ok for colon, please schedule.

## 2018-08-23 ENCOUNTER — Encounter: Payer: Self-pay | Admitting: Gastroenterology

## 2018-09-04 DIAGNOSIS — Z23 Encounter for immunization: Secondary | ICD-10-CM | POA: Diagnosis not present

## 2018-09-10 ENCOUNTER — Ambulatory Visit (AMBULATORY_SURGERY_CENTER): Payer: Self-pay | Admitting: *Deleted

## 2018-09-10 ENCOUNTER — Other Ambulatory Visit: Payer: Self-pay

## 2018-09-10 VITALS — Ht 61.5 in | Wt 222.2 lb

## 2018-09-10 DIAGNOSIS — Z86010 Personal history of colonic polyps: Secondary | ICD-10-CM

## 2018-09-10 MED ORDER — SOD PICOSULFATE-MAG OX-CIT ACD 10-3.5-12 MG-GM -GM/160ML PO SOLN: 1.0000 | Freq: Once | ORAL | 0 refills | Status: AC

## 2018-09-10 NOTE — Progress Notes (Signed)
Patient denies any allergies to egg or soy products. Patient Has PONV with anesthesia.  Patient denies oxygen use at home and denies diet medications. Patient denied information on colonoscopy.

## 2018-09-21 ENCOUNTER — Other Ambulatory Visit: Payer: Self-pay | Admitting: Gastroenterology

## 2018-09-25 DIAGNOSIS — L820 Inflamed seborrheic keratosis: Secondary | ICD-10-CM | POA: Diagnosis not present

## 2018-09-26 ENCOUNTER — Encounter: Payer: Self-pay | Admitting: Gastroenterology

## 2018-10-10 ENCOUNTER — Ambulatory Visit (AMBULATORY_SURGERY_CENTER): Payer: BLUE CROSS/BLUE SHIELD | Admitting: Gastroenterology

## 2018-10-10 ENCOUNTER — Encounter: Payer: Self-pay | Admitting: Gastroenterology

## 2018-10-10 ENCOUNTER — Encounter

## 2018-10-10 VITALS — BP 124/79 | HR 66 | Temp 98.2°F | Resp 18 | Ht 61.5 in | Wt 222.0 lb

## 2018-10-10 DIAGNOSIS — D125 Benign neoplasm of sigmoid colon: Secondary | ICD-10-CM | POA: Diagnosis not present

## 2018-10-10 DIAGNOSIS — D175 Benign lipomatous neoplasm of intra-abdominal organs: Secondary | ICD-10-CM

## 2018-10-10 DIAGNOSIS — Z86010 Personal history of colonic polyps: Secondary | ICD-10-CM | POA: Diagnosis not present

## 2018-10-10 DIAGNOSIS — K639 Disease of intestine, unspecified: Secondary | ICD-10-CM | POA: Diagnosis not present

## 2018-10-10 DIAGNOSIS — Z1211 Encounter for screening for malignant neoplasm of colon: Secondary | ICD-10-CM | POA: Diagnosis not present

## 2018-10-10 MED ORDER — SODIUM CHLORIDE 0.9 % IV SOLN: 500.0000 mL | Freq: Once | INTRAVENOUS | Status: DC

## 2018-10-10 NOTE — Progress Notes (Signed)
Pt's states no medical or surgical changes since previsit or office visit. 

## 2018-10-10 NOTE — Progress Notes (Signed)
Called to room to assist during endoscopic procedure.  Patient ID and intended procedure confirmed with present staff. Received instructions for my participation in the procedure from the performing physician.  

## 2018-10-10 NOTE — Op Note (Addendum)
[place] Patient Name: Sandra Suarez Procedure Date: 10/10/2018 8:28 AM MRN: [id number] Endoscopist: Jackquline Denmark , MD Age: 62 Referring MD:  Date of Birth: 06-19-1956 Gender: Female Account #: [id number] Procedure:                Colonoscopy Indications:              High risk colon cancer surveillance: Personal                            history of colonic polyps. 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 The patient's tolerance of                            previous anesthesia was also reviewed. The risks                            and benefits of the procedure and the sedation                            options and risks were discussed with the patient.                            All questions were answered, and informed consent                            was obtained. Prior Anticoagulants: The patient has                            taken no previous anticoagulant or antiplatelet                            agents. ASA Grade Assessment: III - A patient with                            severe systemic disease. After reviewing the risks                            and benefits, the patient was deemed in  satisfactory condition to undergo the procedure.                           After obtaining informed consent, the colonoscope                            was passed under direct vision. Throughout the                            procedure, the patient's blood pressure, pulse, and                            oxygen saturations were monitored continuously. The                            Colonoscope was  introduced through the anus and                            advanced to the 2 cm into the ileum. The                            colonoscopy was performed without difficulty. The                            patient tolerated the procedure well. The quality                            of the bowel preparation was good. Scope In: 8:33:20 AM Scope Out: 8:53:58 AM Scope Withdrawal Time: 0 hours 17 minutes 4 seconds  Total Procedure Duration: 0 hours 20 minutes 38 seconds  Findings:                 A 4 mm polyp was found in the proximal sigmoid                            colon. The polyp was sessile. The polyp was removed                            with a cold biopsy forceps. Resection and retrieval                            were complete. Estimated blood loss: none.                           Multiple small-mouthed diverticula were found in                            the sigmoid colon.                           Prominent IC valve s/p cold Bx.                           The exam was otherwise without abnormality.  Anorectal scar was noted. There was no recurrence                            of any anorectal lesion. Small thrombosed internal                            hemorrhoids were noted. Complications:            No immediate complications. Estimated Blood Loss:     Estimated blood loss: none. Impression:               -Colonic polyp status post polypectomy.                           -Mild sigmoid diverticulosis.                           -The examination was otherwise normal. Normal                            anorectal exam. Recommendation:           - Patient has a contact number available for                            emergencies. The signs and symptoms of potential                            delayed complications were discussed with the                            patient. Return to normal activities tomorrow.                            Written discharge  instructions were provided to the                            patient.                           - High fiber diet.                           - Continue present medications.                           - Await pathology results.                           - Repeat colonoscopy in 3 years for screening                            purposes.                           - Return to GI office PRN. Jackquline Denmark, MD 10/10/2018 9:04:43 AM This report has been signed electronically.

## 2018-10-10 NOTE — Patient Instructions (Signed)
Please read handouts provided. Await pathology results. Continue present medications.     YOU HAD AN ENDOSCOPIC PROCEDURE TODAY AT [facility]:   Refer to the procedure report that was given to you for any specific questions about what was found during the examination.  If the procedure report does not answer your questions, please call your gastroenterologist to clarify.  If you requested that your care partner not be given the details of your procedure findings, then the procedure report has been included in a sealed envelope for you to review at your convenience later.  YOU SHOULD EXPECT: Some feelings of bloating in the abdomen. Passage of more gas than usual.  Walking can help get rid of the air that was put into your GI tract during the procedure and reduce the bloating. If you had a lower endoscopy (such as a colonoscopy or flexible sigmoidoscopy) you may notice spotting of blood in your stool or on the toilet paper. If you underwent a bowel prep for your procedure, you may not have a normal bowel movement for a few days.  Please Note:  You might notice some irritation and congestion in your nose or some drainage.  This is from the oxygen used during your procedure.  There is no need for concern and it should clear up in a day or so.  SYMPTOMS TO REPORT IMMEDIATELY:   Following lower endoscopy (colonoscopy or flexible sigmoidoscopy):  Excessive amounts of blood in the stool  Significant tenderness or worsening of abdominal pains  Swelling of the abdomen that is new, acute  Fever of 100F or higher    For urgent or emergent issues, a gastroenterologist can be reached at any hour by calling [phone].   DIET:  We do recommend a small meal at first, but then you may proceed to your regular diet.  Drink plenty of fluids but you should avoid alcoholic beverages for 24 hours.  ACTIVITY:  You should plan to take it easy for the rest of today and you should NOT DRIVE  or use heavy machinery until tomorrow (because of the sedation medicines used during the test).    FOLLOW UP: Our staff will call the number listed on your records the next business day following your procedure to check on you and address any questions or concerns that you may have regarding the information given to you following your procedure. If we do not reach you, we will leave a message.  However, if you are feeling well and you are not experiencing any problems, there is no need to return our call.  We will assume that you have returned to your regular daily activities without incident.  If any biopsies were taken you will be contacted by phone or by letter within the next 1-3 weeks.  Please call us at [phone] if you have not heard about the biopsies in 3 weeks.    SIGNATURES/CONFIDENTIALITY: You and/or your care partner have signed paperwork which will be entered into your electronic medical record.  These signatures attest to the fact that that the information above on your After Visit Summary has been reviewed and is understood.  Full responsibility of the confidentiality of this discharge information lies with you and/or your care-partner.

## 2018-10-10 NOTE — Progress Notes (Signed)
A/ox3 pleased with MAC, report to RN 

## 2018-10-11 ENCOUNTER — Telehealth: Payer: Self-pay

## 2018-10-11 NOTE — Telephone Encounter (Signed)
  Follow up Call-  Call back number 10/10/2018  Post procedure Call Back phone  # [phone]  Permission to leave phone message Yes  Some recent data might be hidden     Patient questions:  Do you have a fever, pain , or abdominal swelling? No. Pain Score  0 *  Have you tolerated food without any problems? Yes.    Have you been able to return to your normal activities? Yes.    Do you have any questions about your discharge instructions: Diet   No. Medications  No. Follow up visit  No.  Do you have questions or concerns about your Care? No.  Actions: * If pain score is 4 or above: No action needed, pain <4.  No problems noted per pt. maw

## 2018-10-16 ENCOUNTER — Encounter: Payer: Self-pay | Admitting: Gastroenterology

## 2019-01-30 DIAGNOSIS — Z1331 Encounter for screening for depression: Secondary | ICD-10-CM | POA: Diagnosis not present

## 2019-01-30 DIAGNOSIS — Z13220 Encounter for screening for lipoid disorders: Secondary | ICD-10-CM | POA: Diagnosis not present

## 2019-01-30 DIAGNOSIS — Z6841 Body Mass Index (BMI) 40.0 and over, adult: Secondary | ICD-10-CM | POA: Diagnosis not present

## 2019-01-30 DIAGNOSIS — Z0000 Encounter for general adult medical examination without abnormal findings: Secondary | ICD-10-CM | POA: Diagnosis not present

## 2019-04-09 DIAGNOSIS — H524 Presbyopia: Secondary | ICD-10-CM | POA: Diagnosis not present

## 2019-04-09 DIAGNOSIS — H2513 Age-related nuclear cataract, bilateral: Secondary | ICD-10-CM | POA: Diagnosis not present

## 2019-04-22 DIAGNOSIS — Z1231 Encounter for screening mammogram for malignant neoplasm of breast: Secondary | ICD-10-CM | POA: Diagnosis not present

## 2019-07-26 DIAGNOSIS — J45909 Unspecified asthma, uncomplicated: Secondary | ICD-10-CM | POA: Diagnosis not present

## 2019-07-26 DIAGNOSIS — Z23 Encounter for immunization: Secondary | ICD-10-CM | POA: Diagnosis not present

## 2019-07-26 DIAGNOSIS — Z6841 Body Mass Index (BMI) 40.0 and over, adult: Secondary | ICD-10-CM | POA: Diagnosis not present

## 2019-10-15 DIAGNOSIS — D485 Neoplasm of uncertain behavior of skin: Secondary | ICD-10-CM | POA: Diagnosis not present

## 2019-10-22 DIAGNOSIS — Z20828 Contact with and (suspected) exposure to other viral communicable diseases: Secondary | ICD-10-CM | POA: Diagnosis not present

## 2019-10-22 DIAGNOSIS — J3489 Other specified disorders of nose and nasal sinuses: Secondary | ICD-10-CM | POA: Diagnosis not present

## 2019-12-28 DIAGNOSIS — Z20828 Contact with and (suspected) exposure to other viral communicable diseases: Secondary | ICD-10-CM | POA: Diagnosis not present

## 2019-12-28 DIAGNOSIS — J3489 Other specified disorders of nose and nasal sinuses: Secondary | ICD-10-CM | POA: Diagnosis not present

## 2020-01-28 DIAGNOSIS — H2513 Age-related nuclear cataract, bilateral: Secondary | ICD-10-CM | POA: Diagnosis not present

## 2020-03-31 DIAGNOSIS — E559 Vitamin D deficiency, unspecified: Secondary | ICD-10-CM | POA: Diagnosis not present

## 2020-03-31 DIAGNOSIS — I10 Essential (primary) hypertension: Secondary | ICD-10-CM | POA: Diagnosis not present

## 2020-04-06 DIAGNOSIS — Z6841 Body Mass Index (BMI) 40.0 and over, adult: Secondary | ICD-10-CM | POA: Diagnosis not present

## 2020-04-06 DIAGNOSIS — Z0000 Encounter for general adult medical examination without abnormal findings: Secondary | ICD-10-CM | POA: Diagnosis not present

## 2020-04-06 DIAGNOSIS — Z1331 Encounter for screening for depression: Secondary | ICD-10-CM | POA: Diagnosis not present

## 2020-04-28 DIAGNOSIS — Z1231 Encounter for screening mammogram for malignant neoplasm of breast: Secondary | ICD-10-CM | POA: Diagnosis not present

## 2020-07-01 DIAGNOSIS — Z20828 Contact with and (suspected) exposure to other viral communicable diseases: Secondary | ICD-10-CM | POA: Diagnosis not present

## 2020-07-28 DIAGNOSIS — H269 Unspecified cataract: Secondary | ICD-10-CM | POA: Diagnosis not present

## 2020-07-28 DIAGNOSIS — Z6841 Body Mass Index (BMI) 40.0 and over, adult: Secondary | ICD-10-CM | POA: Diagnosis not present

## 2020-07-28 DIAGNOSIS — J45909 Unspecified asthma, uncomplicated: Secondary | ICD-10-CM | POA: Diagnosis not present

## 2020-07-31 DIAGNOSIS — Z01818 Encounter for other preprocedural examination: Secondary | ICD-10-CM | POA: Diagnosis not present

## 2020-08-03 DIAGNOSIS — Z79899 Other long term (current) drug therapy: Secondary | ICD-10-CM | POA: Diagnosis not present

## 2020-08-03 DIAGNOSIS — K219 Gastro-esophageal reflux disease without esophagitis: Secondary | ICD-10-CM | POA: Diagnosis not present

## 2020-08-03 DIAGNOSIS — H2512 Age-related nuclear cataract, left eye: Secondary | ICD-10-CM | POA: Diagnosis not present

## 2020-08-03 DIAGNOSIS — Z96653 Presence of artificial knee joint, bilateral: Secondary | ICD-10-CM | POA: Diagnosis not present

## 2020-08-03 DIAGNOSIS — E669 Obesity, unspecified: Secondary | ICD-10-CM | POA: Diagnosis not present

## 2020-08-03 DIAGNOSIS — H2511 Age-related nuclear cataract, right eye: Secondary | ICD-10-CM | POA: Diagnosis not present

## 2020-08-03 DIAGNOSIS — I10 Essential (primary) hypertension: Secondary | ICD-10-CM | POA: Diagnosis not present

## 2020-08-03 DIAGNOSIS — J45909 Unspecified asthma, uncomplicated: Secondary | ICD-10-CM | POA: Diagnosis not present

## 2020-08-14 DIAGNOSIS — Z01818 Encounter for other preprocedural examination: Secondary | ICD-10-CM | POA: Diagnosis not present

## 2020-08-17 DIAGNOSIS — I10 Essential (primary) hypertension: Secondary | ICD-10-CM | POA: Diagnosis not present

## 2020-08-17 DIAGNOSIS — J45909 Unspecified asthma, uncomplicated: Secondary | ICD-10-CM | POA: Diagnosis not present

## 2020-08-17 DIAGNOSIS — Z79899 Other long term (current) drug therapy: Secondary | ICD-10-CM | POA: Diagnosis not present

## 2020-08-17 DIAGNOSIS — K219 Gastro-esophageal reflux disease without esophagitis: Secondary | ICD-10-CM | POA: Diagnosis not present

## 2020-08-17 DIAGNOSIS — Z96653 Presence of artificial knee joint, bilateral: Secondary | ICD-10-CM | POA: Diagnosis not present

## 2020-08-17 DIAGNOSIS — H2511 Age-related nuclear cataract, right eye: Secondary | ICD-10-CM | POA: Diagnosis not present

## 2020-10-20 DIAGNOSIS — L814 Other melanin hyperpigmentation: Secondary | ICD-10-CM | POA: Diagnosis not present

## 2020-10-20 DIAGNOSIS — D1801 Hemangioma of skin and subcutaneous tissue: Secondary | ICD-10-CM | POA: Diagnosis not present

## 2020-10-20 DIAGNOSIS — R233 Spontaneous ecchymoses: Secondary | ICD-10-CM | POA: Diagnosis not present

## 2020-10-20 DIAGNOSIS — L821 Other seborrheic keratosis: Secondary | ICD-10-CM | POA: Diagnosis not present

## 2020-10-30 DIAGNOSIS — Z1152 Encounter for screening for COVID-19: Secondary | ICD-10-CM | POA: Diagnosis not present

## 2020-11-30 DIAGNOSIS — Z1152 Encounter for screening for COVID-19: Secondary | ICD-10-CM | POA: Diagnosis not present

## 2020-11-30 DIAGNOSIS — U071 COVID-19: Secondary | ICD-10-CM | POA: Diagnosis not present

## 2021-01-12 DIAGNOSIS — I10 Essential (primary) hypertension: Secondary | ICD-10-CM | POA: Diagnosis not present

## 2021-01-12 DIAGNOSIS — K219 Gastro-esophageal reflux disease without esophagitis: Secondary | ICD-10-CM | POA: Diagnosis not present

## 2021-01-12 DIAGNOSIS — E559 Vitamin D deficiency, unspecified: Secondary | ICD-10-CM | POA: Diagnosis not present

## 2021-01-12 DIAGNOSIS — Z6841 Body Mass Index (BMI) 40.0 and over, adult: Secondary | ICD-10-CM | POA: Diagnosis not present

## 2021-05-18 DIAGNOSIS — Z1231 Encounter for screening mammogram for malignant neoplasm of breast: Secondary | ICD-10-CM | POA: Diagnosis not present

## 2021-09-02 DIAGNOSIS — G8929 Other chronic pain: Secondary | ICD-10-CM | POA: Diagnosis not present

## 2021-09-02 DIAGNOSIS — M25512 Pain in left shoulder: Secondary | ICD-10-CM | POA: Diagnosis not present

## 2021-11-23 DIAGNOSIS — M8580 Other specified disorders of bone density and structure, unspecified site: Secondary | ICD-10-CM | POA: Diagnosis not present

## 2021-11-23 DIAGNOSIS — J45909 Unspecified asthma, uncomplicated: Secondary | ICD-10-CM | POA: Diagnosis not present

## 2021-11-23 DIAGNOSIS — Z6837 Body mass index (BMI) 37.0-37.9, adult: Secondary | ICD-10-CM | POA: Diagnosis not present

## 2021-11-23 DIAGNOSIS — Z79899 Other long term (current) drug therapy: Secondary | ICD-10-CM | POA: Diagnosis not present

## 2021-11-23 DIAGNOSIS — Z1331 Encounter for screening for depression: Secondary | ICD-10-CM | POA: Diagnosis not present

## 2021-11-23 DIAGNOSIS — E559 Vitamin D deficiency, unspecified: Secondary | ICD-10-CM | POA: Diagnosis not present

## 2021-11-23 DIAGNOSIS — I10 Essential (primary) hypertension: Secondary | ICD-10-CM | POA: Diagnosis not present

## 2022-04-20 DIAGNOSIS — L814 Other melanin hyperpigmentation: Secondary | ICD-10-CM | POA: Diagnosis not present

## 2022-04-20 DIAGNOSIS — L820 Inflamed seborrheic keratosis: Secondary | ICD-10-CM | POA: Diagnosis not present

## 2022-04-20 DIAGNOSIS — D225 Melanocytic nevi of trunk: Secondary | ICD-10-CM | POA: Diagnosis not present

## 2022-04-20 DIAGNOSIS — L821 Other seborrheic keratosis: Secondary | ICD-10-CM | POA: Diagnosis not present

## 2022-04-20 DIAGNOSIS — R233 Spontaneous ecchymoses: Secondary | ICD-10-CM | POA: Diagnosis not present

## 2022-06-06 DIAGNOSIS — Z1231 Encounter for screening mammogram for malignant neoplasm of breast: Secondary | ICD-10-CM | POA: Diagnosis not present

## 2022-09-08 DIAGNOSIS — M25512 Pain in left shoulder: Secondary | ICD-10-CM | POA: Diagnosis not present

## 2022-12-28 DIAGNOSIS — Z1331 Encounter for screening for depression: Secondary | ICD-10-CM | POA: Diagnosis not present

## 2022-12-28 DIAGNOSIS — Z6837 Body mass index (BMI) 37.0-37.9, adult: Secondary | ICD-10-CM | POA: Diagnosis not present

## 2022-12-28 DIAGNOSIS — Z79899 Other long term (current) drug therapy: Secondary | ICD-10-CM | POA: Diagnosis not present

## 2022-12-28 DIAGNOSIS — Z0000 Encounter for general adult medical examination without abnormal findings: Secondary | ICD-10-CM | POA: Diagnosis not present

## 2022-12-28 DIAGNOSIS — J45909 Unspecified asthma, uncomplicated: Secondary | ICD-10-CM | POA: Diagnosis not present

## 2022-12-28 DIAGNOSIS — E559 Vitamin D deficiency, unspecified: Secondary | ICD-10-CM | POA: Diagnosis not present

## 2022-12-28 DIAGNOSIS — Z6838 Body mass index (BMI) 38.0-38.9, adult: Secondary | ICD-10-CM | POA: Diagnosis not present

## 2022-12-28 DIAGNOSIS — I10 Essential (primary) hypertension: Secondary | ICD-10-CM | POA: Diagnosis not present

## 2023-04-21 DIAGNOSIS — L820 Inflamed seborrheic keratosis: Secondary | ICD-10-CM | POA: Diagnosis not present

## 2023-04-21 DIAGNOSIS — L578 Other skin changes due to chronic exposure to nonionizing radiation: Secondary | ICD-10-CM | POA: Diagnosis not present

## 2023-04-21 DIAGNOSIS — L814 Other melanin hyperpigmentation: Secondary | ICD-10-CM | POA: Diagnosis not present

## 2023-04-21 DIAGNOSIS — D225 Melanocytic nevi of trunk: Secondary | ICD-10-CM | POA: Diagnosis not present

## 2023-04-26 DIAGNOSIS — G4710 Hypersomnia, unspecified: Secondary | ICD-10-CM | POA: Diagnosis not present

## 2023-04-27 DIAGNOSIS — G4710 Hypersomnia, unspecified: Secondary | ICD-10-CM | POA: Diagnosis not present

## 2023-04-28 DIAGNOSIS — G4733 Obstructive sleep apnea (adult) (pediatric): Secondary | ICD-10-CM | POA: Diagnosis not present

## 2023-05-15 DIAGNOSIS — G4733 Obstructive sleep apnea (adult) (pediatric): Secondary | ICD-10-CM | POA: Diagnosis not present

## 2023-05-17 DIAGNOSIS — H26493 Other secondary cataract, bilateral: Secondary | ICD-10-CM | POA: Diagnosis not present

## 2023-06-12 DIAGNOSIS — Z1231 Encounter for screening mammogram for malignant neoplasm of breast: Secondary | ICD-10-CM | POA: Diagnosis not present

## 2023-06-14 DIAGNOSIS — G4733 Obstructive sleep apnea (adult) (pediatric): Secondary | ICD-10-CM | POA: Diagnosis not present

## 2023-09-25 ENCOUNTER — Encounter: Payer: Self-pay | Admitting: Gastroenterology

## 2023-11-08 ENCOUNTER — Telehealth: Payer: Self-pay | Admitting: Gastroenterology

## 2023-11-08 ENCOUNTER — Ambulatory Visit (AMBULATORY_SURGERY_CENTER): Payer: PPO

## 2023-11-08 VITALS — Ht 62.0 in | Wt 178.0 lb

## 2023-11-08 DIAGNOSIS — Z83719 Family history of colon polyps, unspecified: Secondary | ICD-10-CM

## 2023-11-08 DIAGNOSIS — Z860100 Personal history of colon polyps, unspecified: Secondary | ICD-10-CM

## 2023-11-08 MED ORDER — CLENPIQ 10-3.5-12 MG-GM -GM/175ML PO SOLN: 1.0000 | ORAL | 0 refills | Status: AC

## 2023-11-08 NOTE — Telephone Encounter (Signed)
Patient is returning a call. Patient stated she has a pre-op appointment today. Please advise.

## 2023-11-08 NOTE — Progress Notes (Signed)
No egg or soy allergy known to patient  No issues known to pt with past sedation with any surgeries or procedures Patient denies ever being told they had issues or difficulty with intubation  No FH of Malignant Hyperthermia Pt is not on diet pills Pt is not on  home 02  OSA no CPAP using retainer mouth piece  Pt is not on blood thinners  Pt reports constipation currently  No A fib or A flutter Have any cardiac testing pending-- no  LOA: independent  Prep: 2 day clenpiq   Patient's chart reviewed by Cathlyn Parsons CNRA prior to previsit and patient appropriate for the [facility].  Previsit completed and red dot placed by patient's name on their procedure day (on provider's schedule).     PV competed with patient. Prep instructions sent via mychart and home address.

## 2023-12-06 ENCOUNTER — Encounter: Payer: Self-pay | Admitting: Gastroenterology

## 2023-12-06 DIAGNOSIS — G4733 Obstructive sleep apnea (adult) (pediatric): Secondary | ICD-10-CM | POA: Diagnosis not present

## 2023-12-08 ENCOUNTER — Encounter: Payer: Self-pay | Admitting: Gastroenterology

## 2023-12-08 ENCOUNTER — Ambulatory Visit (AMBULATORY_SURGERY_CENTER): Payer: PPO | Admitting: Gastroenterology

## 2023-12-08 VITALS — BP 117/69 | HR 64 | Temp 97.9°F | Resp 13 | Ht 62.0 in | Wt 178.0 lb

## 2023-12-08 DIAGNOSIS — I10 Essential (primary) hypertension: Secondary | ICD-10-CM | POA: Diagnosis not present

## 2023-12-08 DIAGNOSIS — Z83719 Family history of colon polyps, unspecified: Secondary | ICD-10-CM

## 2023-12-08 DIAGNOSIS — K760 Fatty (change of) liver, not elsewhere classified: Secondary | ICD-10-CM | POA: Diagnosis not present

## 2023-12-08 DIAGNOSIS — K640 First degree hemorrhoids: Secondary | ICD-10-CM | POA: Diagnosis not present

## 2023-12-08 DIAGNOSIS — Z1211 Encounter for screening for malignant neoplasm of colon: Secondary | ICD-10-CM

## 2023-12-08 DIAGNOSIS — K5730 Diverticulosis of large intestine without perforation or abscess without bleeding: Secondary | ICD-10-CM

## 2023-12-08 DIAGNOSIS — Z860100 Personal history of colon polyps, unspecified: Secondary | ICD-10-CM

## 2023-12-08 DIAGNOSIS — D120 Benign neoplasm of cecum: Secondary | ICD-10-CM | POA: Diagnosis not present

## 2023-12-08 MED ORDER — SODIUM CHLORIDE 0.9 % IV SOLN: 500.0000 mL | INTRAVENOUS | Status: AC

## 2023-12-08 NOTE — Progress Notes (Signed)
Pt's states no medical or surgical changes since previsit or office visit. 

## 2023-12-08 NOTE — Progress Notes (Signed)
Sedate, gd SR, tolerated procedure well, VSS, report to RN 

## 2023-12-08 NOTE — Patient Instructions (Signed)
Handouts provided on polyps, diverticulosis and hemorrhoids.  Resume previous diet.  Continue present medications.  No aspirin, ibuprofen, naproxen, or other non-steriodal anti-inflammatory drugs (NSAIDs) for 5 days after polyp removal. You may use Tylenol if needed for mild pain or fever.  Await pathology results.  Repeat colonoscopy for surveillance based on pathology results.    YOU HAD AN ENDOSCOPIC PROCEDURE TODAY AT THE [facility]:   Refer to the procedure report that was given to you for any specific questions about what was found during the examination.  If the procedure report does not answer your questions, please call your gastroenterologist to clarify.  If you requested that your care partner not be given the details of your procedure findings, then the procedure report has been included in a sealed envelope for you to review at your convenience later.  YOU SHOULD EXPECT: Some feelings of bloating in the abdomen. Passage of more gas than usual.  Walking can help get rid of the air that was put into your GI tract during the procedure and reduce the bloating. If you had a lower endoscopy (such as a colonoscopy or flexible sigmoidoscopy) you may notice spotting of blood in your stool or on the toilet paper. If you underwent a bowel prep for your procedure, you may not have a normal bowel movement for a few days.  Please Note:  You might notice some irritation and congestion in your nose or some drainage.  This is from the oxygen used during your procedure.  There is no need for concern and it should clear up in a day or so.  SYMPTOMS TO REPORT IMMEDIATELY:  Following lower endoscopy (colonoscopy or flexible sigmoidoscopy):  Excessive amounts of blood in the stool  Significant tenderness or worsening of abdominal pains  Swelling of the abdomen that is new, acute  Fever of 100F or higher  For urgent or emergent issues, a gastroenterologist can be reached at any hour by  calling [phone]. Do not use MyChart messaging for urgent concerns.    DIET:  We do recommend a small meal at first, but then you may proceed to your regular diet.  Drink plenty of fluids but you should avoid alcoholic beverages for 24 hours.  ACTIVITY:  You should plan to take it easy for the rest of today and you should NOT DRIVE or use heavy machinery until tomorrow (because of the sedation medicines used during the test).    FOLLOW UP: Our staff will call the number listed on your records the next business day following your procedure.  We will call around 7:15- 8:00 am to check on you and address any questions or concerns that you may have regarding the information given to you following your procedure. If we do not reach you, we will leave a message.     If any biopsies were taken you will be contacted by phone or by letter within the next 1-3 weeks.  Please call us at [phone] if you have not heard about the biopsies in 3 weeks.    SIGNATURES/CONFIDENTIALITY: You and/or your care partner have signed paperwork which will be entered into your electronic medical record.  These signatures attest to the fact that that the information above on your After Visit Summary has been reviewed and is understood.  Full responsibility of the confidentiality of this discharge information lies with you and/or your care-partner.

## 2023-12-08 NOTE — Op Note (Signed)
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 The patient's tolerance of                            previous anesthesia was also reviewed. The risks                            and benefits of the procedure and the sedation                            options and risks were discussed with the patient.                            All questions were answered, and informed consent                            was obtained. Prior Anticoagulants: The patient has                            taken no anticoagulant or antiplatelet agents. ASA                            Grade Assessment: II - A patient with mild systemic                            disease. After reviewing the risks and benefits,                            the patient was deemed in satisfactory condition to  undergo the procedure.                           After obtaining informed consent, the colonoscope                            was passed under direct vision. Throughout the                            procedure, the patient's blood pressure, pulse, and                            oxygen saturations were monitored continuously. The                            Olympus Scope SN  [id number] was introduced through the                            anus and advanced to the the cecum, identified by                            appendiceal orifice and ileocecal valve. The                            colonoscopy was performed without difficulty. The                            patient tolerated the procedure well. The quality                            of the bowel preparation was good. The ileocecal                            valve, appendiceal orifice, and rectum were                            photographed. Scope In: 11:37:27 AM Scope Out: 11:54:23 AM Scope Withdrawal Time: 0 hours 13 minutes 2 seconds  Total Procedure Duration: 0 hours 16 minutes 56 seconds  Findings:                 An 8 mm polyp was found in the cecum. The polyp was                            sessile. The polyp was removed with a hot snare.                            Resection and retrieval were complete.                           A few small-mouthed diverticula were found in the                            sigmoid colon.  Non-bleeding internal hemorrhoids were found during                            retroflexion. The hemorrhoids were small and Grade                            I (internal hemorrhoids that do not prolapse).                            Anorectal scar was noted.                           The exam was otherwise without abnormality on                            direct and retroflexion views. Complications:            No immediate complications. Estimated Blood Loss:     Estimated blood loss: none. Impression:               - One 8 mm polyp in the cecum, removed with a hot                            snare. Resected and retrieved.                           - Mild sigmoid diverticulosis.                           - Non-bleeding internal hemorrhoids.                           - The examination was otherwise normal on direct                            and retroflexion  views. Recommendation:           - Patient has a contact number available for                            emergencies. The signs and symptoms of potential                            delayed complications were discussed with the                            patient. Return to normal activities tomorrow.                            Written discharge instructions were provided to the                            patient.                           - Resume previous diet.                           -  Continue present medications.                           - No aspirin, ibuprofen, naproxen, or other                            non-steroidal anti-inflammatory drugs for 5 days                            after polyp removal.                           - Await pathology results.                           - Repeat colonoscopy for surveillance based on                            pathology results.                           - The findings and recommendations were discussed                            with the patient's family. Lynann Bologna, MD 12/08/2023 11:59:27 AM This report has been signed electronically.

## 2023-12-08 NOTE — Progress Notes (Signed)
Called to room to assist during endoscopic procedure.  Patient ID and intended procedure confirmed with present staff. Received instructions for my participation in the procedure from the performing physician.  

## 2023-12-08 NOTE — Progress Notes (Signed)
[facility] History and Physical   Primary Care Physician:  Marylen Ponto, MD   Reason for Procedure:    history of polyps/ family history of polyps  Plan:     colonoscopy     HPI: ARLYLE STRATE is a 68 y.o. female    Past Medical History:  Diagnosis Date   Allergy    Anemia    Arthritis    knees   Asthma    Colon polyps 2015   Fatty liver    GERD (gastroesophageal reflux disease)    Hypertension    PONV (postoperative nausea and vomiting)    none recently   SVD (spontaneous vaginal delivery)    x 2    Past Surgical History:  Procedure Laterality Date   ABDOMINAL HYSTERECTOMY     BREAST SURGERY Right    cyst x3   COLONOSCOPY  09/2014   [facility] at [facility] - polyps   EYE SURGERY Left 89   tumor -plate   FRACTURE SURGERY Left 07   KNEE ARTHROSCOPY Right    LEG SURGERY Right 79   fx    SHOULDER ARTHROSCOPY Bilateral 03/2008   x 2 -rotator cuff   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 Authorizing Provider  BREZTRI AEROSPHERE 160-9-4.8 MCG/ACT AERO Inhale into the lungs.   Yes [provider]  hydrochlorothiazide (MICROZIDE) 12.5 MG capsule Take 12.5 mg by mouth daily. 10/16/23  Yes [provider]  lansoprazole (PREVACID) 30 MG capsule Take 1 capsule (30 mg total) by mouth 2 (two) times daily. 12/25/15  Yes Kozlow, Alvira Philips, MD  losartan (COZAAR) 50 MG tablet Take 50 mg by mouth daily. 10/16/23  Yes [provider]  montelukast (SINGULAIR) 10 MG tablet TAKE 1 TABLET ONCE DAILY. 03/06/17  Yes Kozlow, Alvira Philips, MD  Sod Picosulfate-Mag Ox-Cit Acd (CLENPIQ) 10-3.5-12 MG-GM -GM/175ML SOLN Take 1 kit by mouth as  directed. 11/08/23  Yes Lynann Bologna, MD  Vitamin D, Ergocalciferol, (DRISDOL) 1.25 MG (50000 UNIT) CAPS capsule Take 50,000 Units by mouth every 7 (seven) days. 10/16/23  Yes [provider]  albuterol (PROVENTIL) (2.5 MG/3ML) 0.083% nebulizer solution Take 2.5 mg by nebulization every 4 (four) hours as needed (cough or wheeze).    [provider]  albuterol (VENTOLIN HFA) 108 (90 Base) MCG/ACT inhaler Inhale 2 puffs into the lungs every 4 (four) hours as needed for wheezing or shortness of breath. 12/25/15   Kozlow, Alvira Philips, MD  Ibuprofen-Diphenhydramine HCl (ADVIL PM) 200-25 MG CAPS Take 3 tablets by mouth at bedtime.     [provider]  TIRZEPATIDE-WEIGHT MANAGEMENT [place] Inject into the skin once a week.    [provider]    Current Outpatient Medications  Medication Sig Dispense Refill   BREZTRI AEROSPHERE 160-9-4.8 MCG/ACT AERO Inhale into the lungs.     hydrochlorothiazide (MICROZIDE) 12.5 MG capsule Take 12.5 mg by mouth daily.     lansoprazole (PREVACID) 30 MG capsule Take 1 capsule (30 mg total) by mouth 2 (two) times daily. 180 capsule 3   losartan (COZAAR) 50 MG tablet Take 50 mg by mouth  daily.     montelukast (SINGULAIR) 10 MG tablet TAKE 1 TABLET ONCE DAILY. 90 tablet 0   Sod Picosulfate-Mag Ox-Cit Acd (CLENPIQ) 10-3.5-12 MG-GM -GM/175ML SOLN Take 1 kit by mouth as directed. 175 mL 0   Vitamin D, Ergocalciferol, (DRISDOL) 1.25 MG (50000 UNIT) CAPS capsule Take 50,000 Units by mouth every 7 (seven) days.     albuterol (PROVENTIL) (2.5 MG/3ML) 0.083% nebulizer solution Take 2.5 mg by nebulization every 4 (four) hours as needed (cough or wheeze).     albuterol (VENTOLIN HFA) 108 (90 Base) MCG/ACT inhaler Inhale 2 puffs into the lungs every 4 (four) hours as needed for wheezing or shortness of breath. 1 Inhaler 5   Ibuprofen-Diphenhydramine HCl (ADVIL PM) 200-25 MG CAPS Take 3 tablets by mouth at bedtime.      TIRZEPATIDE-WEIGHT MANAGEMENT [place] Inject  into the skin once a week.     Current Facility-Administered Medications  Medication Dose Route Frequency Provider Last Rate Last Admin   0.9 %  sodium chloride infusion  500 mL Intravenous Continuous Lynann Bologna, MD       Mepolizumab SOLR 100 mg  100 mg Subcutaneous Q28 days Kozlow, Alvira Philips, MD       predniSONE (DELTASONE) tablet 20 mg  20 mg Oral Once Marcelyn Bruins, MD        Allergies as of 12/08/2023   (No Known Allergies)    Family History  Problem Relation Age of Onset   Colon polyps Mother    High blood pressure Mother    High Cholesterol Mother    Atrial fibrillation Mother    Diabetes Father    Dementia Father    High blood pressure Father    Breast cancer Sister    Cancer Maternal Aunt    Cancer Maternal Uncle    Cancer Maternal Grandmother    Colon cancer Neg Hx    Rectal cancer Neg Hx    Stomach cancer Neg Hx     Social History   Socioeconomic History   Marital status: Married    Spouse name: Not on file   Number of children: Not on file   Years of education: Not on file   Highest education level: Not on file  Occupational History   Not on file  Tobacco Use   Smoking status: Never   Smokeless tobacco: Never  Vaping Use   Vaping status: Never Used  Substance and Sexual Activity   Alcohol use: Yes    Comment: occasional mixed drink   Drug use: No   Sexual activity: Not on file    Comment: Hysterectomy  Other Topics Concern   Not on file  Social History Narrative   Not on file   Social Drivers of Health   Financial Resource Strain: Not on file  Food Insecurity: Not on file  Transportation Needs: Not on file  Physical Activity: Not on file  Stress: Not on file  Social Connections: Not on file  Intimate Partner Violence: Not on file    Review of Systems: Positive for none All other review of systems negative except as mentioned in the HPI.  Physical Exam: Vital signs in last 24 hours: @VSRANGES @   General:   Alert,   Well-developed, well-nourished, pleasant and cooperative in NAD Lungs:  Clear throughout to auscultation.   Heart:  Regular rate and rhythm; no murmurs, clicks, rubs,  or gallops. Abdomen:  Soft, nontender and nondistended. Normal bowel sounds.   Neuro/Psych:  Alert and cooperative. Normal mood  and affect. A and O x 3    No significant changes were identified.  The patient continues to be an appropriate candidate for the planned procedure and anesthesia.   Edman Circle, MD. [facility] 12/08/2023 12:07 PM@

## 2023-12-11 ENCOUNTER — Telehealth: Payer: Self-pay

## 2023-12-11 NOTE — Telephone Encounter (Signed)
  Follow up Call-     12/08/2023   11:09 AM  Call back number  Post procedure Call Back phone  # [phone]  Permission to leave phone message Yes     Patient questions:  Do you have a fever, pain , or abdominal swelling? No. Pain Score  0 *  Have you tolerated food without any problems? Yes.    Have you been able to return to your normal activities? Yes.    Do you have any questions about your discharge instructions: Diet   No. Medications  No. Follow up visit  No.  Do you have questions or concerns about your Care? No.  Actions: * If pain score is 4 or above: No action needed, pain <4.

## 2023-12-13 LAB — SURGICAL PATHOLOGY

## 2023-12-17 ENCOUNTER — Encounter: Payer: Self-pay | Admitting: Gastroenterology

## 2024-01-02 DIAGNOSIS — Z1339 Encounter for screening examination for other mental health and behavioral disorders: Secondary | ICD-10-CM | POA: Diagnosis not present

## 2024-01-02 DIAGNOSIS — Z79899 Other long term (current) drug therapy: Secondary | ICD-10-CM | POA: Diagnosis not present

## 2024-01-02 DIAGNOSIS — E559 Vitamin D deficiency, unspecified: Secondary | ICD-10-CM | POA: Diagnosis not present

## 2024-01-02 DIAGNOSIS — M8580 Other specified disorders of bone density and structure, unspecified site: Secondary | ICD-10-CM | POA: Diagnosis not present

## 2024-01-02 DIAGNOSIS — K219 Gastro-esophageal reflux disease without esophagitis: Secondary | ICD-10-CM | POA: Diagnosis not present

## 2024-01-02 DIAGNOSIS — Z0000 Encounter for general adult medical examination without abnormal findings: Secondary | ICD-10-CM | POA: Diagnosis not present

## 2024-01-02 DIAGNOSIS — J45909 Unspecified asthma, uncomplicated: Secondary | ICD-10-CM | POA: Diagnosis not present

## 2024-01-02 DIAGNOSIS — Z1331 Encounter for screening for depression: Secondary | ICD-10-CM | POA: Diagnosis not present

## 2024-01-02 DIAGNOSIS — I10 Essential (primary) hypertension: Secondary | ICD-10-CM | POA: Diagnosis not present

## 2024-01-02 DIAGNOSIS — Z6833 Body mass index (BMI) 33.0-33.9, adult: Secondary | ICD-10-CM | POA: Diagnosis not present

## 2024-03-13 DIAGNOSIS — H43393 Other vitreous opacities, bilateral: Secondary | ICD-10-CM | POA: Diagnosis not present

## 2024-05-23 DIAGNOSIS — G4733 Obstructive sleep apnea (adult) (pediatric): Secondary | ICD-10-CM | POA: Diagnosis not present

## 2024-05-27 DIAGNOSIS — L814 Other melanin hyperpigmentation: Secondary | ICD-10-CM | POA: Diagnosis not present

## 2024-05-27 DIAGNOSIS — L299 Pruritus, unspecified: Secondary | ICD-10-CM | POA: Diagnosis not present

## 2024-05-27 DIAGNOSIS — L300 Nummular dermatitis: Secondary | ICD-10-CM | POA: Diagnosis not present

## 2024-05-27 DIAGNOSIS — D225 Melanocytic nevi of trunk: Secondary | ICD-10-CM | POA: Diagnosis not present

## 2024-07-08 DIAGNOSIS — Z1231 Encounter for screening mammogram for malignant neoplasm of breast: Secondary | ICD-10-CM | POA: Diagnosis not present
